# Patient Record
Sex: Female | Born: 1945 | Race: White | Hispanic: No | Smoking: Never smoker
Health system: Southern US, Community
[De-identification: ages and names within clinical notes are randomized; demographics above are authoritative.]

## PROBLEM LIST (undated history)

## (undated) DIAGNOSIS — R569 Unspecified convulsions: Secondary | ICD-10-CM

## (undated) DIAGNOSIS — L309 Dermatitis, unspecified: Secondary | ICD-10-CM

## (undated) DIAGNOSIS — K219 Gastro-esophageal reflux disease without esophagitis: Secondary | ICD-10-CM

## (undated) DIAGNOSIS — M8580 Other specified disorders of bone density and structure, unspecified site: Secondary | ICD-10-CM

## (undated) DIAGNOSIS — G2581 Restless legs syndrome: Secondary | ICD-10-CM

## (undated) DIAGNOSIS — G40909 Epilepsy, unspecified, not intractable, without status epilepticus: Secondary | ICD-10-CM

## (undated) HISTORY — DX: Other specified disorders of bone density and structure, unspecified site: M85.80

## (undated) HISTORY — DX: Dermatitis, unspecified: L30.9

## (undated) HISTORY — DX: Restless legs syndrome: G25.81

## (undated) HISTORY — PX: UVULOPLASTY: SHX6557

## (undated) HISTORY — DX: Epilepsy, unspecified, not intractable, without status epilepticus: G40.909

---

## 1986-11-24 HISTORY — PX: ABDOMINAL HYSTERECTOMY: SHX81

## 1998-11-24 LAB — HM COLONOSCOPY

## 1999-03-06 ENCOUNTER — Other Ambulatory Visit: Admission: RE | Admit: 1999-03-06 | Discharge: 1999-03-06 | Payer: Self-pay | Admitting: Family Medicine

## 1999-05-02 ENCOUNTER — Ambulatory Visit: Admission: RE | Admit: 1999-05-02 | Discharge: 1999-05-02 | Payer: Self-pay | Admitting: Otolaryngology

## 1999-06-06 ENCOUNTER — Other Ambulatory Visit: Admission: RE | Admit: 1999-06-06 | Discharge: 1999-06-06 | Payer: Self-pay | Admitting: Otolaryngology

## 1999-06-06 ENCOUNTER — Encounter (INDEPENDENT_AMBULATORY_CARE_PROVIDER_SITE_OTHER): Payer: Self-pay | Admitting: Specialist

## 2000-06-26 ENCOUNTER — Encounter: Payer: Self-pay | Admitting: Family Medicine

## 2000-06-26 ENCOUNTER — Encounter: Admission: RE | Admit: 2000-06-26 | Discharge: 2000-06-26 | Payer: Self-pay | Admitting: Family Medicine

## 2001-11-24 LAB — FECAL OCCULT BLOOD, GUAIAC: Fecal Occult Blood: NEGATIVE

## 2004-08-08 ENCOUNTER — Encounter: Admission: RE | Admit: 2004-08-08 | Discharge: 2004-08-08 | Payer: Self-pay | Admitting: Family Medicine

## 2004-09-26 ENCOUNTER — Ambulatory Visit: Payer: Self-pay | Admitting: Licensed Clinical Social Worker

## 2004-10-03 ENCOUNTER — Ambulatory Visit: Payer: Self-pay | Admitting: Licensed Clinical Social Worker

## 2004-10-10 ENCOUNTER — Ambulatory Visit: Payer: Self-pay | Admitting: Licensed Clinical Social Worker

## 2004-10-25 ENCOUNTER — Ambulatory Visit: Payer: Self-pay | Admitting: Licensed Clinical Social Worker

## 2004-11-01 ENCOUNTER — Ambulatory Visit: Payer: Self-pay | Admitting: Licensed Clinical Social Worker

## 2004-11-07 ENCOUNTER — Ambulatory Visit: Payer: Self-pay | Admitting: Licensed Clinical Social Worker

## 2004-11-11 ENCOUNTER — Ambulatory Visit: Payer: Self-pay | Admitting: Licensed Clinical Social Worker

## 2004-11-12 ENCOUNTER — Ambulatory Visit: Payer: Self-pay | Admitting: Licensed Clinical Social Worker

## 2004-11-21 ENCOUNTER — Ambulatory Visit: Payer: Self-pay | Admitting: Licensed Clinical Social Worker

## 2004-11-28 ENCOUNTER — Ambulatory Visit: Payer: Self-pay | Admitting: Licensed Clinical Social Worker

## 2004-12-05 ENCOUNTER — Ambulatory Visit: Payer: Self-pay | Admitting: Licensed Clinical Social Worker

## 2004-12-12 ENCOUNTER — Ambulatory Visit: Payer: Self-pay | Admitting: Licensed Clinical Social Worker

## 2004-12-19 ENCOUNTER — Ambulatory Visit: Payer: Self-pay | Admitting: Licensed Clinical Social Worker

## 2004-12-27 ENCOUNTER — Ambulatory Visit: Payer: Self-pay | Admitting: Licensed Clinical Social Worker

## 2005-01-09 ENCOUNTER — Ambulatory Visit: Payer: Self-pay | Admitting: Licensed Clinical Social Worker

## 2005-01-16 ENCOUNTER — Ambulatory Visit: Payer: Self-pay | Admitting: Licensed Clinical Social Worker

## 2005-01-23 ENCOUNTER — Ambulatory Visit: Payer: Self-pay | Admitting: Licensed Clinical Social Worker

## 2005-01-30 ENCOUNTER — Ambulatory Visit: Payer: Self-pay | Admitting: Licensed Clinical Social Worker

## 2005-02-20 ENCOUNTER — Ambulatory Visit: Payer: Self-pay | Admitting: Licensed Clinical Social Worker

## 2005-02-27 ENCOUNTER — Ambulatory Visit: Payer: Self-pay | Admitting: Licensed Clinical Social Worker

## 2005-03-06 ENCOUNTER — Ambulatory Visit: Payer: Self-pay | Admitting: Licensed Clinical Social Worker

## 2005-03-13 ENCOUNTER — Ambulatory Visit: Payer: Self-pay | Admitting: Licensed Clinical Social Worker

## 2005-03-20 ENCOUNTER — Ambulatory Visit: Payer: Self-pay | Admitting: Licensed Clinical Social Worker

## 2005-03-27 ENCOUNTER — Ambulatory Visit: Payer: Self-pay | Admitting: Licensed Clinical Social Worker

## 2005-04-03 ENCOUNTER — Ambulatory Visit: Payer: Self-pay | Admitting: Licensed Clinical Social Worker

## 2005-04-24 ENCOUNTER — Ambulatory Visit: Payer: Self-pay | Admitting: Licensed Clinical Social Worker

## 2005-05-01 ENCOUNTER — Ambulatory Visit: Payer: Self-pay | Admitting: Licensed Clinical Social Worker

## 2005-05-13 ENCOUNTER — Ambulatory Visit: Payer: Self-pay | Admitting: Licensed Clinical Social Worker

## 2005-05-29 ENCOUNTER — Ambulatory Visit: Payer: Self-pay | Admitting: Licensed Clinical Social Worker

## 2005-06-12 ENCOUNTER — Ambulatory Visit: Payer: Self-pay | Admitting: Licensed Clinical Social Worker

## 2005-06-19 ENCOUNTER — Ambulatory Visit: Payer: Self-pay | Admitting: Licensed Clinical Social Worker

## 2005-07-04 ENCOUNTER — Ambulatory Visit: Payer: Self-pay | Admitting: Licensed Clinical Social Worker

## 2005-07-10 ENCOUNTER — Ambulatory Visit: Payer: Self-pay | Admitting: Licensed Clinical Social Worker

## 2005-07-17 ENCOUNTER — Ambulatory Visit: Payer: Self-pay | Admitting: Licensed Clinical Social Worker

## 2005-07-31 ENCOUNTER — Ambulatory Visit: Payer: Self-pay | Admitting: Licensed Clinical Social Worker

## 2005-08-14 ENCOUNTER — Ambulatory Visit: Payer: Self-pay | Admitting: Licensed Clinical Social Worker

## 2005-08-28 ENCOUNTER — Ambulatory Visit: Payer: Self-pay | Admitting: Licensed Clinical Social Worker

## 2005-09-11 ENCOUNTER — Ambulatory Visit: Payer: Self-pay | Admitting: Licensed Clinical Social Worker

## 2005-10-09 ENCOUNTER — Ambulatory Visit: Payer: Self-pay | Admitting: Licensed Clinical Social Worker

## 2006-04-28 ENCOUNTER — Ambulatory Visit: Payer: Self-pay | Admitting: Family Medicine

## 2006-05-05 ENCOUNTER — Ambulatory Visit: Payer: Self-pay | Admitting: Family Medicine

## 2006-05-21 ENCOUNTER — Ambulatory Visit: Payer: Self-pay | Admitting: Internal Medicine

## 2006-06-02 ENCOUNTER — Encounter: Admission: RE | Admit: 2006-06-02 | Discharge: 2006-06-02 | Payer: Self-pay | Admitting: Family Medicine

## 2006-08-31 ENCOUNTER — Ambulatory Visit: Payer: Self-pay | Admitting: Family Medicine

## 2006-11-10 ENCOUNTER — Ambulatory Visit: Payer: Self-pay | Admitting: Licensed Clinical Social Worker

## 2006-12-01 ENCOUNTER — Ambulatory Visit: Payer: Self-pay | Admitting: Licensed Clinical Social Worker

## 2006-12-15 ENCOUNTER — Ambulatory Visit: Payer: Self-pay | Admitting: Licensed Clinical Social Worker

## 2006-12-29 ENCOUNTER — Ambulatory Visit: Payer: Self-pay | Admitting: Licensed Clinical Social Worker

## 2006-12-31 ENCOUNTER — Ambulatory Visit: Payer: Self-pay | Admitting: Family Medicine

## 2007-01-04 ENCOUNTER — Ambulatory Visit: Payer: Self-pay | Admitting: Family Medicine

## 2007-01-12 ENCOUNTER — Ambulatory Visit: Payer: Self-pay | Admitting: Licensed Clinical Social Worker

## 2007-01-26 ENCOUNTER — Ambulatory Visit: Payer: Self-pay | Admitting: Licensed Clinical Social Worker

## 2007-02-18 ENCOUNTER — Ambulatory Visit: Payer: Self-pay | Admitting: Licensed Clinical Social Worker

## 2007-03-04 ENCOUNTER — Ambulatory Visit: Payer: Self-pay | Admitting: Licensed Clinical Social Worker

## 2007-03-18 ENCOUNTER — Ambulatory Visit: Payer: Self-pay | Admitting: Licensed Clinical Social Worker

## 2007-04-15 ENCOUNTER — Ambulatory Visit: Payer: Self-pay | Admitting: Licensed Clinical Social Worker

## 2007-04-15 ENCOUNTER — Ambulatory Visit: Payer: Self-pay | Admitting: Family Medicine

## 2007-04-15 DIAGNOSIS — G40909 Epilepsy, unspecified, not intractable, without status epilepticus: Secondary | ICD-10-CM | POA: Insufficient documentation

## 2007-04-15 DIAGNOSIS — M949 Disorder of cartilage, unspecified: Secondary | ICD-10-CM

## 2007-04-15 DIAGNOSIS — M899 Disorder of bone, unspecified: Secondary | ICD-10-CM

## 2007-04-15 DIAGNOSIS — E109 Type 1 diabetes mellitus without complications: Secondary | ICD-10-CM | POA: Insufficient documentation

## 2007-04-27 ENCOUNTER — Ambulatory Visit: Payer: Self-pay | Admitting: Licensed Clinical Social Worker

## 2007-04-27 ENCOUNTER — Ambulatory Visit: Payer: Self-pay | Admitting: Family Medicine

## 2007-04-27 LAB — CONVERTED CEMR LAB
ALT: 27 units/L (ref 0–40)
AST: 24 units/L (ref 0–37)
Albumin: 4.1 g/dL (ref 3.5–5.2)
Alkaline Phosphatase: 68 units/L (ref 39–117)
BUN: 17 mg/dL (ref 6–23)
Basophils Absolute: 0 10*3/uL (ref 0.0–0.1)
Basophils Relative: 0.4 % (ref 0.0–1.0)
Bilirubin, Direct: 0.1 mg/dL (ref 0.0–0.3)
CO2: 33 meq/L — ABNORMAL HIGH (ref 19–32)
Calcium: 9.9 mg/dL (ref 8.4–10.5)
Chloride: 102 meq/L (ref 96–112)
Cholesterol: 240 mg/dL (ref 0–200)
Creatinine, Ser: 0.8 mg/dL (ref 0.4–1.2)
Direct LDL: 130.2 mg/dL
Eosinophils Absolute: 0.1 10*3/uL (ref 0.0–0.6)
Eosinophils Relative: 1.8 % (ref 0.0–5.0)
GFR calc Af Amer: 93 mL/min
GFR calc non Af Amer: 77 mL/min
Glucose, Bld: 119 mg/dL — ABNORMAL HIGH (ref 70–99)
HCT: 40.3 % (ref 36.0–46.0)
HDL: 65.4 mg/dL (ref >39.0)
Hemoglobin: 14 g/dL (ref 12.0–15.0)
Hgb A1c MFr Bld: 5.7 % (ref 4.6–6.0)
Lymphocytes Relative: 32 % (ref 12.0–46.0)
MCHC: 34.7 g/dL (ref 30.0–36.0)
MCV: 92.7 fL (ref 78.0–100.0)
Monocytes Absolute: 0.5 10*3/uL (ref 0.2–0.7)
Monocytes Relative: 9.5 % (ref 3.0–11.0)
Neutro Abs: 3.1 10*3/uL (ref 1.4–7.7)
Neutrophils Relative %: 56.3 % (ref 43.0–77.0)
Platelets: 237 10*3/uL (ref 150–400)
Potassium: 4.9 meq/L (ref 3.5–5.1)
RBC: 4.34 M/uL (ref 3.87–5.11)
RDW: 13 % (ref 11.5–14.6)
Sodium: 142 meq/L (ref 135–145)
TSH: 1.12 microintl units/mL (ref 0.35–5.50)
Total Bilirubin: 0.7 mg/dL (ref 0.3–1.2)
Total CHOL/HDL Ratio: 3.7
Total Protein: 7.6 g/dL (ref 6.0–8.3)
Triglycerides: 239 mg/dL (ref 0–149)
VLDL: 48 mg/dL — ABNORMAL HIGH (ref 0–40)
WBC: 5.4 10*3/uL (ref 4.5–10.5)

## 2007-05-04 ENCOUNTER — Ambulatory Visit: Payer: Self-pay | Admitting: Licensed Clinical Social Worker

## 2007-05-04 ENCOUNTER — Ambulatory Visit: Payer: Self-pay | Admitting: Family Medicine

## 2007-05-18 ENCOUNTER — Ambulatory Visit: Payer: Self-pay | Admitting: Licensed Clinical Social Worker

## 2007-05-25 ENCOUNTER — Ambulatory Visit: Payer: Self-pay | Admitting: Licensed Clinical Social Worker

## 2007-06-01 ENCOUNTER — Ambulatory Visit: Payer: Self-pay | Admitting: Licensed Clinical Social Worker

## 2007-06-10 ENCOUNTER — Ambulatory Visit: Payer: Self-pay | Admitting: Licensed Clinical Social Worker

## 2007-06-15 ENCOUNTER — Ambulatory Visit: Payer: Self-pay | Admitting: Licensed Clinical Social Worker

## 2007-06-22 ENCOUNTER — Ambulatory Visit: Payer: Self-pay | Admitting: Licensed Clinical Social Worker

## 2007-06-22 ENCOUNTER — Encounter: Admission: RE | Admit: 2007-06-22 | Discharge: 2007-06-22 | Payer: Self-pay | Admitting: Family Medicine

## 2007-07-01 ENCOUNTER — Ambulatory Visit: Payer: Self-pay | Admitting: Licensed Clinical Social Worker

## 2007-07-08 ENCOUNTER — Encounter: Payer: Self-pay | Admitting: Family Medicine

## 2007-07-08 ENCOUNTER — Ambulatory Visit: Payer: Self-pay | Admitting: Licensed Clinical Social Worker

## 2007-07-15 ENCOUNTER — Ambulatory Visit: Payer: Self-pay | Admitting: Licensed Clinical Social Worker

## 2007-07-22 ENCOUNTER — Ambulatory Visit: Payer: Self-pay | Admitting: Licensed Clinical Social Worker

## 2007-08-12 ENCOUNTER — Ambulatory Visit: Payer: Self-pay | Admitting: Licensed Clinical Social Worker

## 2007-08-19 ENCOUNTER — Ambulatory Visit: Payer: Self-pay | Admitting: Licensed Clinical Social Worker

## 2007-08-26 ENCOUNTER — Ambulatory Visit: Payer: Self-pay | Admitting: Licensed Clinical Social Worker

## 2007-09-09 ENCOUNTER — Ambulatory Visit: Payer: Self-pay | Admitting: Licensed Clinical Social Worker

## 2007-09-16 ENCOUNTER — Ambulatory Visit: Payer: Self-pay | Admitting: Licensed Clinical Social Worker

## 2007-09-23 ENCOUNTER — Ambulatory Visit: Payer: Self-pay | Admitting: Licensed Clinical Social Worker

## 2007-09-30 ENCOUNTER — Ambulatory Visit: Payer: Self-pay | Admitting: Licensed Clinical Social Worker

## 2007-10-07 ENCOUNTER — Ambulatory Visit: Payer: Self-pay | Admitting: Licensed Clinical Social Worker

## 2007-10-14 ENCOUNTER — Ambulatory Visit: Payer: Self-pay | Admitting: Licensed Clinical Social Worker

## 2007-10-28 ENCOUNTER — Ambulatory Visit: Payer: Self-pay | Admitting: Licensed Clinical Social Worker

## 2007-11-04 ENCOUNTER — Ambulatory Visit: Payer: Self-pay | Admitting: Licensed Clinical Social Worker

## 2007-11-11 ENCOUNTER — Ambulatory Visit: Payer: Self-pay | Admitting: Licensed Clinical Social Worker

## 2007-11-17 ENCOUNTER — Ambulatory Visit: Payer: Self-pay | Admitting: Licensed Clinical Social Worker

## 2007-11-30 ENCOUNTER — Ambulatory Visit: Payer: Self-pay | Admitting: Licensed Clinical Social Worker

## 2007-12-07 ENCOUNTER — Ambulatory Visit: Payer: Self-pay | Admitting: Licensed Clinical Social Worker

## 2007-12-16 ENCOUNTER — Ambulatory Visit: Payer: Self-pay | Admitting: Licensed Clinical Social Worker

## 2007-12-30 ENCOUNTER — Ambulatory Visit: Payer: Self-pay | Admitting: Licensed Clinical Social Worker

## 2008-01-06 ENCOUNTER — Ambulatory Visit: Payer: Self-pay | Admitting: Licensed Clinical Social Worker

## 2008-01-13 ENCOUNTER — Ambulatory Visit: Payer: Self-pay | Admitting: Licensed Clinical Social Worker

## 2008-01-13 ENCOUNTER — Encounter: Payer: Self-pay | Admitting: Family Medicine

## 2008-01-20 ENCOUNTER — Ambulatory Visit: Payer: Self-pay | Admitting: Licensed Clinical Social Worker

## 2008-01-27 ENCOUNTER — Ambulatory Visit: Payer: Self-pay | Admitting: Licensed Clinical Social Worker

## 2008-02-03 ENCOUNTER — Ambulatory Visit: Payer: Self-pay | Admitting: Licensed Clinical Social Worker

## 2009-01-17 ENCOUNTER — Telehealth: Payer: Self-pay | Admitting: Family Medicine

## 2009-01-23 ENCOUNTER — Ambulatory Visit: Payer: Self-pay | Admitting: Family Medicine

## 2009-01-23 LAB — CONVERTED CEMR LAB
ALT: 27 units/L (ref 0–35)
AST: 24 units/L (ref 0–37)
Alkaline Phosphatase: 75 units/L (ref 39–117)
Bilirubin Urine: NEGATIVE
Bilirubin, Direct: 0.1 mg/dL (ref 0.0–0.3)
Blood in Urine, dipstick: NEGATIVE
CO2: 29 meq/L (ref 19–32)
Chloride: 105 meq/L (ref 96–112)
Cholesterol: 209 mg/dL (ref 0–200)
Glucose, Bld: 90 mg/dL (ref 70–99)
Glucose, Urine, Semiquant: NEGATIVE
Ketones, urine, test strip: NEGATIVE
Lymphocytes Relative: 46.8 % — ABNORMAL HIGH (ref 12.0–46.0)
Monocytes Relative: 9.8 % (ref 3.0–12.0)
Nitrite: NEGATIVE
Platelets: 240 10*3/uL (ref 150–400)
Potassium: 3.8 meq/L (ref 3.5–5.1)
Protein, U semiquant: NEGATIVE
RDW: 13.3 % (ref 11.5–14.6)
Sodium: 142 meq/L (ref 135–145)
Specific Gravity, Urine: 1.02
Total CHOL/HDL Ratio: 3.4
Total Protein: 7.3 g/dL (ref 6.0–8.3)
Triglycerides: 116 mg/dL (ref 0–149)
Urobilinogen, UA: 0.2
VLDL: 23 mg/dL (ref 0–40)
WBC Urine, dipstick: NEGATIVE
WBC: 5.1 10*3/uL (ref 4.5–10.5)
pH: 5.5

## 2009-02-06 ENCOUNTER — Ambulatory Visit: Payer: Self-pay | Admitting: Family Medicine

## 2009-02-06 DIAGNOSIS — H9319 Tinnitus, unspecified ear: Secondary | ICD-10-CM | POA: Insufficient documentation

## 2009-02-07 ENCOUNTER — Telehealth: Payer: Self-pay | Admitting: Family Medicine

## 2009-02-20 ENCOUNTER — Encounter (INDEPENDENT_AMBULATORY_CARE_PROVIDER_SITE_OTHER): Payer: Self-pay | Admitting: *Deleted

## 2009-12-06 ENCOUNTER — Telehealth: Payer: Self-pay | Admitting: Internal Medicine

## 2010-04-15 ENCOUNTER — Telehealth: Payer: Self-pay | Admitting: Family Medicine

## 2010-05-30 ENCOUNTER — Ambulatory Visit: Payer: Self-pay | Admitting: Licensed Clinical Social Worker

## 2010-06-04 ENCOUNTER — Ambulatory Visit: Payer: Self-pay | Admitting: Family Medicine

## 2010-06-04 LAB — HM DIABETES FOOT EXAM

## 2010-06-05 ENCOUNTER — Telehealth: Payer: Self-pay | Admitting: Family Medicine

## 2010-06-06 ENCOUNTER — Ambulatory Visit: Payer: Self-pay | Admitting: Licensed Clinical Social Worker

## 2010-06-12 ENCOUNTER — Encounter: Admission: RE | Admit: 2010-06-12 | Discharge: 2010-06-12 | Payer: Self-pay | Admitting: Family Medicine

## 2010-06-12 LAB — HM MAMMOGRAPHY

## 2010-06-20 ENCOUNTER — Ambulatory Visit: Payer: Self-pay | Admitting: Licensed Clinical Social Worker

## 2010-07-04 ENCOUNTER — Ambulatory Visit: Payer: Self-pay | Admitting: Licensed Clinical Social Worker

## 2010-07-17 ENCOUNTER — Ambulatory Visit: Payer: Self-pay | Admitting: Licensed Clinical Social Worker

## 2010-12-22 LAB — CONVERTED CEMR LAB
ALT: 21 units/L (ref 0–35)
Alkaline Phosphatase: 74 units/L (ref 39–117)
Basophils Absolute: 0 10*3/uL (ref 0.0–0.1)
Bilirubin, Direct: 0.1 mg/dL (ref 0.0–0.3)
CO2: 31 meq/L (ref 19–32)
Chloride: 108 meq/L (ref 96–112)
Glucose, Bld: 94 mg/dL (ref 70–99)
HCT: 38.4 % (ref 36.0–46.0)
Hemoglobin: 13.3 g/dL (ref 12.0–15.0)
Lymphs Abs: 2.7 10*3/uL (ref 0.7–4.0)
MCHC: 34.7 g/dL (ref 30.0–36.0)
Monocytes Relative: 11.6 % (ref 3.0–12.0)
Neutro Abs: 2 10*3/uL (ref 1.4–7.7)
Phenytoin Lvl: 21.7 ug/mL — ABNORMAL HIGH (ref 10.0–20.0)
Potassium: 5.3 meq/L — ABNORMAL HIGH (ref 3.5–5.1)
RDW: 14 % (ref 11.5–14.6)
Sodium: 146 meq/L — ABNORMAL HIGH (ref 135–145)
Total Protein: 7.1 g/dL (ref 6.0–8.3)
Triglycerides: 438 mg/dL — ABNORMAL HIGH (ref 0.0–149.0)

## 2010-12-26 NOTE — Progress Notes (Signed)
  Phone Note Outgoing Call   Summary of Call: labs okay, except for Dilantin level 26.  Would like to see around 15.  Left message for her to decrease her dose to two tabs daily.  Follow-up Dilantin level in 4 weeks Initial call taken by: Roderick Pee MD,  June 05, 2010 9:14 AM

## 2010-12-26 NOTE — Assessment & Plan Note (Signed)
Summary: emp-will fast//ccm   Vital Signs:  Patient profile:   65 year old female Menstrual status:  hysterectomy Height:      59.5 inches Weight:      154 pounds BMI:     30.69 Temp:     98.8 degrees F oral BP sitting:   120 / 84  (left arm) Cuff size:   regular  Vitals Entered By: Kern Reap CMA Duncan Dull) (June 04, 2010 10:47 AM) CC: cpx Is Patient Diabetic? No Pain Assessment Patient in pain? no          Menstrual Status hysterectomy   CC:  cpx.  History of Present Illness: Jennifer Mcneil is a 65 year old female, nonsmoker, who comes in today for evaluation of a chronic seizure disorder, postmenopausal vaginal dryness.  Mild depression.  Her seizure disorder is treated with Dilantin 300 mg daily asymptomatic on medication.  Will check Dilantin level today.  She takes Fosamax 35 mg for osteo- tenia.  She's been on this medication now for over 3 years.  She will stop the Fosamax.  She uses Premarin vaginal cream once or twice weekly for vaginal dryness.  She also takes Prozac 40 mg daily for depression.  Would like to decrease the dose to 20.  She gets routine eye care.  Dental care does BSE monthly, but has not had a mammogram in over 3 years colonoscopy.  Normal.  GI tetanus 2004 declines a flu shot to recommend she get an annual flu shot and call the day and get set up for a mammogram Here for Medicare AWV:  1.   Risk factors based on Past M, S, F history:.....reviewed.  No change 2.   Physical Activities: Marland Kitchen..walks daily 3.   Depression/mood: ........mood good would like to decrease her Prozac from 40 mg a day to 20 4.   Hearing: ...normal 5.   ADL's: ...Marland KitchenMarland KitchenMarland Kitchennormal 6.   Fall Risk.......Marland Kitchenreviewed none  7.   Home Safety: .......Marland Kitchenreviewed 8.   Height, weight, &visual acuity.......Marland Kitchenheight weight, normal eye exam yearly by ophthalmologist 9.   Counseling: ........decrease the Prozacs 11.           Referral Coordination...9 12.           Care Plan........Marland Kitchenreviewed her total  care.  Plan 13.            Cognitive Assessment .........normal mentation  Allergies (verified): No Known Drug Allergies  Past History:  Past medical, surgical, family and social histories (including risk factors) reviewed, and no changes noted (except as noted below).  Past Medical History: Reviewed history from 04/15/2007 and no changes required. Diabetes mellitus, type I Osteopenia Seizure disorder DYS Eczma  Past Surgical History: Reviewed history from 04/15/2007 and no changes required. Hysterectomy/TAH /BSO CB X 1  Family History: Reviewed history and no changes required.  Social History: Reviewed history from 02/06/2009 and no changes required. Retired Single Never Smoked Alcohol use-no Drug use-no Regular exercise-yes  Review of Systems      See HPI  Physical Exam  General:  Well-developed,well-nourished,in no acute distress; alert,appropriate and cooperative throughout examination Head:  Normocephalic and atraumatic without obvious abnormalities. No apparent alopecia or balding. Eyes:  No corneal or conjunctival inflammation noted. EOMI. Perrla. Funduscopic exam benign, without hemorrhages, exudates or papilledema. Vision grossly normal. Ears:  External ear exam shows no significant lesions or deformities.  Otoscopic examination reveals clear canals, tympanic membranes are intact bilaterally without bulging, retraction, inflammation or discharge. Hearing is grossly normal bilaterally. Nose:  External nasal examination  shows no deformity or inflammation. Nasal mucosa are pink and moist without lesions or exudates. Mouth:  Oral mucosa and oropharynx without lesions or exudates.  Teeth in good repair. Neck:  No deformities, masses, or tenderness noted. Chest Wall:  No deformities, masses, or tenderness noted. Breasts:  No mass, nodules, thickening, tenderness, bulging, retraction, inflamation, nipple discharge or skin changes noted.   Lungs:  Normal respiratory  effort, chest expands symmetrically. Lungs are clear to auscultation, no crackles or wheezes. Heart:  Normal rate and regular rhythm. S1 and S2 normal without gallop, murmur, click, rub or other extra sounds. Abdomen:  Bowel sounds positive,abdomen soft and non-tender without masses, organomegaly or hernias noted. Rectal:  No external abnormalities noted. Normal sphincter tone. No rectal masses or tenderness. Genitalia:  Pelvic Exam:        External: normal female genitalia without lesions or masses        Vagina: normal without lesions or masses        Cervix: normal without lesions or masses        Adnexa: normal bimanual exam without masses or fullness        Uterus: normal by palpation        Pap smear: not performed Msk:  No deformity or scoliosis noted of thoracic or lumbar spine.   Pulses:  R and L carotid,radial,femoral,dorsalis pedis and posterior tibial pulses are full and equal bilaterally Extremities:  No clubbing, cyanosis, edema, or deformity noted with normal full range of motion of all joints.   Neurologic:  No cranial nerve deficits noted. Station and gait are normal. Plantar reflexes are down-going bilaterally. DTRs are symmetrical throughout. Sensory, motor and coordinative functions appear intact. Skin:  Intact without suspicious lesions or rashes Cervical Nodes:  No lymphadenopathy noted Axillary Nodes:  No palpable lymphadenopathy Inguinal Nodes:  No significant adenopathy Psych:  Cognition and judgment appear intact. Alert and cooperative with normal attention span and concentration. No apparent delusions, illusions, hallucinations  Diabetes Management Exam:    Foot Exam (with socks and/or shoes not present):       Sensory-Pinprick/Light touch:          Left medial foot (L-4): normal          Left dorsal foot (L-5): normal          Left lateral foot (S-1): normal          Right medial foot (L-4): normal          Right dorsal foot (L-5): normal          Right  lateral foot (S-1): normal       Sensory-Monofilament:          Left foot: normal          Right foot: normal       Inspection:          Left foot: normal          Right foot: normal       Nails:          Left foot: normal          Right foot: normal    Eye Exam:       Eye Exam done elsewhere          Date: 08/01/2010          Results: normal          Done by: opth   Impression & Recommendations:  Problem # 1:  SEIZURE DISORDER (  ICD-780.39) Assessment Unchanged  Her updated medication list for this problem includes:    Dilantin 100 Mg Caps (Phenytoin sodium extended) .Marland KitchenMarland KitchenMarland KitchenMarland Kitchen 3 capsule by mouth every morning  Orders: Prescription Created Electronically [phone]) First annual wellness visit with prevention plan  (N5621) UA Dipstick w/o Micro (automated)  (81003) Venipuncture (30865) TLB-Lipid Panel (80061-LIPID) TLB-BMP (Basic Metabolic Panel-BMET) (80048-METABOL) TLB-CBC Platelet - w/Differential (85025-CBCD) TLB-Hepatic/Liver Function Pnl (80076-HEPATIC) TLB-TSH (Thyroid Stimulating Hormone) (84443-TSH) TLB-A1C / Hgb A1C (Glycohemoglobin) (83036-A1C) TLB-Phenytoin (Dilantin) (80185-DILAN)  Problem # 2:  OSTEOPENIA (ICD-733.90) Assessment: Improved  The following medications were removed from the medication list:    Fosamax 35 Mg Tabs (Alendronate sodium)  Orders: Prescription Created Electronically [phone]) First annual wellness visit with prevention plan  (G2952) UA Dipstick w/o Micro (automated)  (81003) Venipuncture (84132) TLB-Lipid Panel (80061-LIPID) TLB-BMP (Basic Metabolic Panel-BMET) (80048-METABOL) TLB-CBC Platelet - w/Differential (85025-CBCD) TLB-Hepatic/Liver Function Pnl (80076-HEPATIC) TLB-TSH (Thyroid Stimulating Hormone) (84443-TSH) TLB-A1C / Hgb A1C (Glycohemoglobin) (83036-A1C)  Problem # 3:  ROUTINE GENERAL MEDICAL EXAM@HEALTH  CARE FACL (ICD-V70.0) Assessment: New  Orders: Prescription Created Electronically [phone]) First annual wellness  visit with prevention plan  (U7253) UA Dipstick w/o Micro (automated)  (81003) Venipuncture (66440) TLB-Lipid Panel (80061-LIPID) TLB-BMP (Basic Metabolic Panel-BMET) (80048-METABOL) TLB-CBC Platelet - w/Differential (85025-CBCD) TLB-Hepatic/Liver Function Pnl (80076-HEPATIC) TLB-TSH (Thyroid Stimulating Hormone) (84443-TSH) TLB-A1C / Hgb A1C (Glycohemoglobin) (83036-A1C)  Complete Medication List: 1)  Dilantin 100 Mg Caps (Phenytoin sodium extended) .... 3 capsule by mouth every morning 2)  Premarin 0.625 Mg/gm Crea (Estrogens, conjugated) .... Insert 1 a small amount into vagina as directed 3)  Probiotic Pearls Caps (Probiotic product) .... Take 2 tabs by mouth once daily 4)  Grape Seed Xtra Caps (Misc natural products) .... Take one tab by mouth once daily 5)  Vitamin C 100 Mg Tabs (Ascorbic acid) .... Take one tab by mouth once daily 6)  Vitamin E 100 Unit Caps (Vitamin e) .... Take one tab by mouth once daily 7)  Folic Acid 1 Mg Tabs (Folic acid) .... Take one tab by mouth once daily 8)  Prozac 20 Mg Caps (Fluoxetine hcl) .... Take 1 tablet by mouth every morning  Patient Instructions: 1)   stop the Fosamax continue calcium, vitamin D, and daily walking.  We will get a follow-up bone density in two years. 2)  Decrease the Prozac to 20 mg daily.  Return p.r.n. 3)  Please schedule a follow-up appointment in 1 year. 4)  Schedule your mammogram.  today 5)  Take an Aspirin every day. Prescriptions: PREMARIN 0.625 MG/GM CREA (ESTROGENS, CONJUGATED) Insert 1 a small amount into vagina as directed  #3 tubes x 4   Entered and Authorized by:   Roderick Pee MD   Signed by:   Roderick Pee MD on 06/04/2010   Method used:   Electronically to        [facility] [phone]* (retail)       [address]       [place], [place]  [address]       Ph: [phone]       Fax: [phone]   RxID:   [id number] DILANTIN 100 MG CAPS (PHENYTOIN SODIUM EXTENDED) 3  capsule by mouth every morning  #300 Capsule x 3   Entered and Authorized by:   Roderick Pee MD   Signed by:   Roderick Pee MD on 06/04/2010   Method used:   [facility] to  [facility] [phone]* (retail)       [address]       [place], [place]  [address]       Ph: [phone]       Fax: [phone]   RxID:   [id number] PROZAC 20 MG CAPS (FLUOXETINE HCL) Take 1 tablet by mouth every morning  #100 x 3   Entered and Authorized by:   Roderick Pee MD   Signed by:   Roderick Pee MD on 06/04/2010   Method used:   Electronically to        [name] [phone]* (retail)       [address]       [place], [place]  [address]       Ph: [phone]       Fax: [phone]   RxID:   [id number]     Appended Document: emp-will fast//ccm     Allergies: No Known Drug Allergies   Complete Medication List: 1)  Dilantin 100 Mg Caps (Phenytoin sodium extended) .... 3 capsule by mouth every morning 2)  Premarin 0.625 Mg/gm Crea (Estrogens, conjugated) .... Insert 1 a small amount into vagina as directed 3)  Probiotic Pearls Caps (Probiotic product) .... Take 2 tabs by mouth once daily 4)  Grape Seed Xtra Caps (Misc natural products) .... Take one tab by mouth once daily 5)  Vitamin C 100 Mg Tabs (Ascorbic acid) .... Take one tab by mouth once daily 6)  Vitamin E 100 Unit Caps (Vitamin e) .... Take one tab by mouth once daily 7)  Folic Acid 1 Mg Tabs (Folic acid) .... Take one tab by mouth once daily 8)  Prozac 20 Mg Caps (Fluoxetine hcl) .... Take 1 tablet by mouth every morning  Other Orders: EKG w/ Interpretation (93000)  Appended Document: emp-will fast//ccm     Allergies: No Known Drug Allergies   Complete Medication List: 1)  Dilantin 100 Mg Caps (Phenytoin sodium extended) .... 3 capsule by mouth every morning 2)  Premarin 0.625 Mg/gm Crea (Estrogens, conjugated) .... Insert 1 a small  amount into vagina as directed 3)  Probiotic Pearls Caps (Probiotic product) .... Take 2 tabs by mouth once daily 4)  Grape Seed Xtra Caps (Misc natural products) .... Take one tab by mouth once daily 5)  Vitamin C 100 Mg Tabs (Ascorbic acid) .... Take one tab by mouth once daily 6)  Vitamin E 100 Unit Caps (Vitamin e) .... Take one tab by mouth once daily 7)  Folic Acid 1 Mg Tabs (Folic acid) .... Take one tab by mouth once daily 8)  Prozac 20 Mg Caps (Fluoxetine hcl) .... Take 1 tablet by mouth every morning  Other Orders: Pneumococcal Vaccine (70350) Admin 1st Vaccine (09381)    Immunizations Administered:  Pneumonia Vaccine:    Vaccine Type: Pneumovax    Site: right deltoid    Mfr: Merck    Dose: 0.5 ml    Route: IM    Given by: Kern Reap CMA (AAMA)    Exp. Date: 09/18/2011    Lot #: [id number]    Physician counseled: yes

## 2010-12-26 NOTE — Progress Notes (Signed)
Summary: refill  Phone Note Refill Request Call back at Home Phone [phone] Message from:  Patient---live call  Refills Requested: Medication #1:  DILANTIN 100 MG CAPS 3 capsule by mouth every morning   [facility] Name Necessary? [facility]px  is in july.     Prescriptions: DILANTIN 100 MG CAPS (PHENYTOIN SODIUM EXTENDED) 3 capsule by mouth every morning  #300 Capsule x 3   Entered by:   Kern Reap CMA (AAMA)   Authorized by:   Roderick Pee MD   Signed by:   Kern Reap CMA (AAMA) on 04/15/2010   Method used:   [facility]ronically to        [facility] [phone]* (retail)       [address]       [place], [place]  [address]       Ph: [phone]       Fax: [phone]   RxID:   [id number]

## 2010-12-26 NOTE — Progress Notes (Signed)
Summary: Schedule Colonoscopy  Phone Note Outgoing Call   Call placed by: Hortense Ramal CMA Duncan Dull),  December 06, 2009 3:01 PM Call placed to: Patient Summary of Call: Left message for patient to call back. She needs to schedule her recall colonoscopy due to age. It has been 10 years since her last colonoscopy procedure. Initial call taken by: Hortense Ramal CMA Duncan Dull),  December 06, 2009 3:01 PM  Follow-up for Phone Call        Left message for patient to call back to schedule colonoscopy. Hortense Ramal CMA Duncan Dull)  December 11, 2009 1:52 PM   Left message to call back. Hortense Ramal CMA Duncan Dull)  December 14, 2009 9:23 AM     Additional Follow-up for Phone Call Additional follow up Details #2::    No call back from patient recieved. We will send a letter. Follow-up by: Hortense Ramal CMA Duncan Dull),  December 18, 2009 9:44 AM   Appended Document: Schedule Colonoscopy May I , please review her paper chart or  scan Colon into EMR?

## 2011-02-25 ENCOUNTER — Ambulatory Visit (INDEPENDENT_AMBULATORY_CARE_PROVIDER_SITE_OTHER): Payer: Medicare HMO | Admitting: Licensed Clinical Social Worker

## 2011-02-25 DIAGNOSIS — F331 Major depressive disorder, recurrent, moderate: Secondary | ICD-10-CM

## 2011-03-04 ENCOUNTER — Ambulatory Visit (INDEPENDENT_AMBULATORY_CARE_PROVIDER_SITE_OTHER): Payer: Medicare HMO | Admitting: Licensed Clinical Social Worker

## 2011-03-04 DIAGNOSIS — F331 Major depressive disorder, recurrent, moderate: Secondary | ICD-10-CM

## 2011-03-11 ENCOUNTER — Ambulatory Visit (INDEPENDENT_AMBULATORY_CARE_PROVIDER_SITE_OTHER): Payer: Medicare HMO | Admitting: Licensed Clinical Social Worker

## 2011-03-11 DIAGNOSIS — F331 Major depressive disorder, recurrent, moderate: Secondary | ICD-10-CM

## 2011-03-25 ENCOUNTER — Ambulatory Visit: Payer: Medicare HMO | Admitting: Licensed Clinical Social Worker

## 2011-03-26 ENCOUNTER — Ambulatory Visit (INDEPENDENT_AMBULATORY_CARE_PROVIDER_SITE_OTHER): Payer: Medicare HMO | Admitting: Licensed Clinical Social Worker

## 2011-03-26 DIAGNOSIS — F331 Major depressive disorder, recurrent, moderate: Secondary | ICD-10-CM

## 2011-04-24 ENCOUNTER — Ambulatory Visit (INDEPENDENT_AMBULATORY_CARE_PROVIDER_SITE_OTHER): Payer: Medicare HMO | Admitting: Licensed Clinical Social Worker

## 2011-04-24 DIAGNOSIS — F331 Major depressive disorder, recurrent, moderate: Secondary | ICD-10-CM

## 2011-05-08 ENCOUNTER — Ambulatory Visit (INDEPENDENT_AMBULATORY_CARE_PROVIDER_SITE_OTHER): Payer: Medicare HMO | Admitting: Licensed Clinical Social Worker

## 2011-05-08 DIAGNOSIS — F331 Major depressive disorder, recurrent, moderate: Secondary | ICD-10-CM

## 2011-05-29 ENCOUNTER — Other Ambulatory Visit: Payer: Self-pay | Admitting: Family Medicine

## 2011-06-06 ENCOUNTER — Ambulatory Visit (INDEPENDENT_AMBULATORY_CARE_PROVIDER_SITE_OTHER): Payer: Medicare HMO | Admitting: Family Medicine

## 2011-06-06 ENCOUNTER — Encounter: Payer: Self-pay | Admitting: Family Medicine

## 2011-06-06 VITALS — BP 130/90 | Temp 98.3°F | Ht 59.5 in | Wt 154.0 lb

## 2011-06-06 DIAGNOSIS — F329 Major depressive disorder, single episode, unspecified: Secondary | ICD-10-CM

## 2011-06-06 DIAGNOSIS — Z0000 Encounter for general adult medical examination without abnormal findings: Secondary | ICD-10-CM

## 2011-06-06 DIAGNOSIS — R569 Unspecified convulsions: Secondary | ICD-10-CM

## 2011-06-06 DIAGNOSIS — E109 Type 1 diabetes mellitus without complications: Secondary | ICD-10-CM

## 2011-06-06 DIAGNOSIS — N952 Postmenopausal atrophic vaginitis: Secondary | ICD-10-CM

## 2011-06-06 LAB — HEPATIC FUNCTION PANEL
ALT: 19 U/L (ref 0–35)
Alkaline Phosphatase: 72 U/L (ref 39–117)
Bilirubin, Direct: 0 mg/dL (ref 0.0–0.3)
Total Protein: 7.7 g/dL (ref 6.0–8.3)

## 2011-06-06 LAB — LIPID PANEL
HDL: 62 mg/dL (ref >39.00)
Total CHOL/HDL Ratio: 4

## 2011-06-06 LAB — CBC WITH DIFFERENTIAL/PLATELET
Basophils Relative: 0.7 % (ref 0.0–3.0)
Hemoglobin: 13.2 g/dL (ref 12.0–15.0)
Lymphocytes Relative: 43.9 % (ref 12.0–46.0)
Monocytes Relative: 9.8 % (ref 3.0–12.0)
Neutro Abs: 2 10*3/uL (ref 1.4–7.7)
RBC: 4.21 Mil/uL (ref 3.87–5.11)
WBC: 5.3 10*3/uL (ref 4.5–10.5)

## 2011-06-06 LAB — POCT URINALYSIS DIPSTICK
Bilirubin, UA: NEGATIVE
Ketones, UA: NEGATIVE
Leukocytes, UA: NEGATIVE
Nitrite, UA: NEGATIVE
Protein, UA: NEGATIVE

## 2011-06-06 LAB — BASIC METABOLIC PANEL
BUN: 13 mg/dL (ref 6–23)
Chloride: 108 mEq/L (ref 96–112)
Creatinine, Ser: 0.7 mg/dL (ref 0.4–1.2)
GFR: 83.12 mL/min (ref >60.00)
Potassium: 4.5 mEq/L (ref 3.5–5.1)

## 2011-06-06 LAB — TSH: TSH: 0.69 u[IU]/mL (ref 0.35–5.50)

## 2011-06-06 MED ORDER — FLUOXETINE HCL 20 MG PO CAPS: 20.0000 mg | ORAL_CAPSULE | Freq: Every day | ORAL | Status: DC

## 2011-06-06 MED ORDER — ESTROGENS, CONJUGATED 0.625 MG/GM VA CREA: 1.0000 g | TOPICAL_CREAM | Freq: Every day | VAGINAL | Status: DC

## 2011-06-06 MED ORDER — PHENYTOIN SODIUM EXTENDED 100 MG PO CAPS: ORAL_CAPSULE | ORAL | Status: DC

## 2011-06-06 NOTE — Patient Instructions (Signed)
Continue your current medications.  Follow-up in one year, sooner if any problems.  Begin a walking program 15 minutes daily

## 2011-06-06 NOTE — Progress Notes (Signed)
  Subjective:    Patient ID: Jennifer Mcneil, female    DOB: 05-24-1946, 65 y.o.   MRN: [id number]  HPI [name] is a 65 year old single female, nonsmoker, who comes in today for general Medicare at wellness examination.  He has a history of underlying seizure disorder, depression, postmenopausal vaginal dryness.  She takes Dilantin 200 mg daily.  Will check Dilantin level.  She takes Prozac 20 mg nightly for mild depression.  Doing well despite the fact that her 41 year old daughter died this year.  Her daughter was a drug addict and died of sepsis.  If uses Premarin vaginal cream twice weekly for vaginal dryness.  She takes calcium and vitamin D, but does not exercise on a regular basis.  She gets routine eye care, dental care, BSE monthly, and you mammography, colonoscopy, normal, and GI, hearing normal, intellectual function independent.,Home safety reviewed the been no issues.  There.  No guns in the house.  She does have a living will, but not a healthcare power of attorney     Review of Systems  Constitutional: Negative.   HENT: Negative.   Eyes: Negative.   Respiratory: Negative.   Cardiovascular: Negative.   Gastrointestinal: Negative.   Genitourinary: Negative.   Musculoskeletal: Negative.   Neurological: Negative.   Hematological: Negative.   Psychiatric/Behavioral: Negative.        Objective:   Physical Exam  Constitutional: She appears well-developed and well-nourished.  HENT:  Head: Normocephalic and atraumatic.  Right Ear: External ear normal.  Left Ear: External ear normal.  Nose: Nose normal.  Mouth/Throat: Oropharynx is clear and moist.  Eyes: EOM are normal. Pupils are equal, round, and reactive to light.  Neck: Normal range of motion. Neck supple. No thyromegaly present.  Cardiovascular: Normal rate, regular rhythm, normal heart sounds and intact distal pulses.  Exam reveals no gallop and no friction rub.   No murmur heard. Pulmonary/Chest: Effort normal  and breath sounds normal.  Abdominal: Soft. Bowel sounds are normal. She exhibits no distension and no mass. There is no tenderness. There is no rebound.  Genitourinary: Vagina normal. Guaiac negative stool. No vaginal discharge found.  Musculoskeletal: Normal range of motion.  Lymphadenopathy:    She has no cervical adenopathy.  Neurological: She is alert. She has normal reflexes. No cranial nerve deficit. She exhibits normal muscle tone. Coordination normal.  Skin: Skin is warm and dry.  Psychiatric: She has a normal mood and affect. Her behavior is normal. Judgment and thought content normal.          Assessment & Plan:  Healthy female.  History of seizure disorder continue Dilantin 200 mg daily and check Dilantin level.  History of depression.  Continue Prozac 20 daily.  Postmenopausal vaginal dryness.  Continue Premarin vaginal cream twice weekly.

## 2011-10-28 ENCOUNTER — Other Ambulatory Visit: Payer: Self-pay | Admitting: Family Medicine

## 2011-11-27 ENCOUNTER — Ambulatory Visit (INDEPENDENT_AMBULATORY_CARE_PROVIDER_SITE_OTHER): Payer: Medicare HMO | Admitting: Family Medicine

## 2011-11-27 ENCOUNTER — Encounter: Payer: Self-pay | Admitting: Family Medicine

## 2011-11-27 DIAGNOSIS — M25511 Pain in right shoulder: Secondary | ICD-10-CM

## 2011-11-27 DIAGNOSIS — G2581 Restless legs syndrome: Secondary | ICD-10-CM

## 2011-11-27 DIAGNOSIS — M25519 Pain in unspecified shoulder: Secondary | ICD-10-CM

## 2011-11-27 DIAGNOSIS — G8929 Other chronic pain: Secondary | ICD-10-CM | POA: Insufficient documentation

## 2011-11-27 MED ORDER — PRAMIPEXOLE DIHYDROCHLORIDE 0.5 MG PO TABS: 0.5000 mg | ORAL_TABLET | Freq: Every day | ORAL | Status: DC

## 2011-11-27 NOTE — Patient Instructions (Signed)
Begin Mirapex, one tablet at bedtime.  Return in 3 weeks for follow-up.  Take 400 mg of Motrin twice daily with food, and we will get to set up for a physical therapy consult for evaluation and treatment

## 2011-11-27 NOTE — Progress Notes (Signed)
  Subjective:    Patient ID: Jennifer Mcneil, female    DOB: January 29, 1946, 66 y.o.   MRN: [id number]  Picking is a 66 year old female, nonsmoker, who comes in today for evaluation in two problems.  She states for the past 3 months.  She is having cramps in both her legs.  It wakes her up at night.  She is to get up and walk around.  Also for the past 6, months.  She's had pain in her right shoulder.  Now she is noticing decreased range of motion.  No history of trauma    Review of Systems General neurologic and musculoskeletal review of systems otherwise negative    Objective:   Physical Exam Well-developed well-nourished, female in acute distress.  Examination of lower extremities shows the legs appear to be normal.  Skin normal.  Pulses normal.  No palpable or abnormalities.  Full range of motion.  Hips, knees, and ankles.  No obvious skin lesions.  Right shoulder shows she is able to externally rotate her shoulder, but she is lacking about 15 degrees in a deduction       Assessment & Plan:  Restless leg syndrome, began Mirapex nightly follow-up in 3 weeks.  Pain right shoulder Motrin 400 mg b.i.d. Physical therapy consult

## 2011-12-08 ENCOUNTER — Ambulatory Visit: Payer: Medicare HMO | Attending: Family Medicine

## 2011-12-08 DIAGNOSIS — M25519 Pain in unspecified shoulder: Secondary | ICD-10-CM | POA: Insufficient documentation

## 2011-12-08 DIAGNOSIS — M25619 Stiffness of unspecified shoulder, not elsewhere classified: Secondary | ICD-10-CM | POA: Insufficient documentation

## 2011-12-08 DIAGNOSIS — IMO0001 Reserved for inherently not codable concepts without codable children: Secondary | ICD-10-CM | POA: Insufficient documentation

## 2011-12-08 DIAGNOSIS — M6281 Muscle weakness (generalized): Secondary | ICD-10-CM | POA: Insufficient documentation

## 2011-12-18 ENCOUNTER — Ambulatory Visit: Payer: Medicare HMO | Admitting: Family Medicine

## 2011-12-23 ENCOUNTER — Ambulatory Visit: Payer: Medicare HMO

## 2012-02-26 ENCOUNTER — Other Ambulatory Visit: Payer: Self-pay | Admitting: Family Medicine

## 2012-04-05 ENCOUNTER — Other Ambulatory Visit: Payer: Self-pay | Admitting: Family Medicine

## 2012-05-23 ENCOUNTER — Other Ambulatory Visit: Payer: Self-pay | Admitting: Family Medicine

## 2012-06-08 ENCOUNTER — Encounter: Payer: Self-pay | Admitting: Internal Medicine

## 2012-09-20 ENCOUNTER — Ambulatory Visit (INDEPENDENT_AMBULATORY_CARE_PROVIDER_SITE_OTHER): Payer: Medicare HMO | Admitting: Family Medicine

## 2012-09-20 ENCOUNTER — Encounter: Payer: Self-pay | Admitting: Family Medicine

## 2012-09-20 VITALS — BP 118/80 | Temp 98.0°F | Ht 58.75 in | Wt 160.0 lb

## 2012-09-20 DIAGNOSIS — Z0000 Encounter for general adult medical examination without abnormal findings: Secondary | ICD-10-CM

## 2012-09-20 DIAGNOSIS — G2581 Restless legs syndrome: Secondary | ICD-10-CM

## 2012-09-20 DIAGNOSIS — F329 Major depressive disorder, single episode, unspecified: Secondary | ICD-10-CM

## 2012-09-20 DIAGNOSIS — N952 Postmenopausal atrophic vaginitis: Secondary | ICD-10-CM

## 2012-09-20 DIAGNOSIS — R569 Unspecified convulsions: Secondary | ICD-10-CM

## 2012-09-20 DIAGNOSIS — Z23 Encounter for immunization: Secondary | ICD-10-CM

## 2012-09-20 DIAGNOSIS — E109 Type 1 diabetes mellitus without complications: Secondary | ICD-10-CM

## 2012-09-20 LAB — CBC WITH DIFFERENTIAL/PLATELET
Basophils Relative: 0.8 % (ref 0.0–3.0)
Eosinophils Relative: 2.5 % (ref 0.0–5.0)
Lymphocytes Relative: 37.9 % (ref 12.0–46.0)
MCV: 91.9 fl (ref 78.0–100.0)
Monocytes Absolute: 0.5 10*3/uL (ref 0.1–1.0)
Monocytes Relative: 9.2 % (ref 3.0–12.0)
Neutrophils Relative %: 49.6 % (ref 43.0–77.0)
RBC: 4.14 Mil/uL (ref 3.87–5.11)
WBC: 5.4 10*3/uL (ref 4.5–10.5)

## 2012-09-20 LAB — BASIC METABOLIC PANEL
CO2: 27 mEq/L (ref 19–32)
Chloride: 108 mEq/L (ref 96–112)
Glucose, Bld: 98 mg/dL (ref 70–99)
Potassium: 4.3 mEq/L (ref 3.5–5.1)
Sodium: 140 mEq/L (ref 135–145)

## 2012-09-20 MED ORDER — PHENYTOIN SODIUM EXTENDED 100 MG PO CAPS: ORAL_CAPSULE | ORAL | Status: DC

## 2012-09-20 MED ORDER — FLUOXETINE HCL 20 MG PO CAPS: 20.0000 mg | ORAL_CAPSULE | Freq: Every day | ORAL | Status: DC

## 2012-09-20 MED ORDER — ESTROGENS, CONJUGATED 0.625 MG/GM VA CREA: 1.0000 g | TOPICAL_CREAM | Freq: Every day | VAGINAL | Status: DC

## 2012-09-20 MED ORDER — PRAMIPEXOLE DIHYDROCHLORIDE 0.5 MG PO TABS: 0.5000 mg | ORAL_TABLET | ORAL | Status: DC

## 2012-09-20 NOTE — Progress Notes (Signed)
  Subjective:    Patient ID: Jennifer Mcneil, female    DOB: 1946/06/29, 66 y.o.   MRN: [id number]  HPI [name] is a 66 year old married female nonsmoker who comes in today for a Medicare wellness examination because of a history of post and a possible vaginal dryness,,,,,,,, she had a TAH and BSO at age 64 because of fibroids,,,,,,,, seizure disorder, restless leg syndrome, mild depression.  Her medications reviewed in detail and there've been no changes. Except her Dilantin dose was decreased to 200 mg daily from 300 mg daily because her Dilantin level last year was slightly elevated.  She gets routine eye care, dental care, BSE monthly, has not had a mammogram in over 2 years, advised to go get a mammogram ASAP, colonoscopy and GI normal, tetanus 2004, Pneumovax 2011, shingles information given, seasonal flu shot today.  Cognitive function normal she walks on a regular basis home health safety reviewed no issues identified, no guns in the house, she does have a health care power of attorney and living will.   Review of Systems  Constitutional: Negative.   HENT: Negative.   Eyes: Negative.   Respiratory: Negative.   Cardiovascular: Negative.   Gastrointestinal: Negative.   Genitourinary: Negative.   Musculoskeletal: Negative.   Neurological: Negative.   Hematological: Negative.   Psychiatric/Behavioral: Negative.        Objective:   Physical Exam  Constitutional: She appears well-developed and well-nourished.  HENT:  Head: Normocephalic and atraumatic.  Right Ear: External ear normal.  Left Ear: External ear normal.  Nose: Nose normal.  Mouth/Throat: Oropharynx is clear and moist.  Eyes: EOM are normal. Pupils are equal, round, and reactive to light.  Neck: Normal range of motion. Neck supple. No thyromegaly present.  Cardiovascular: Normal rate, regular rhythm, normal heart sounds and intact distal pulses.  Exam reveals no gallop and no friction rub.   No murmur  heard. Pulmonary/Chest: Effort normal and breath sounds normal.  Abdominal: Soft. Bowel sounds are normal. She exhibits no distension and no mass. There is no tenderness. There is no rebound.  Musculoskeletal: Normal range of motion.  Lymphadenopathy:    She has no cervical adenopathy.  Neurological: She is alert. She has normal reflexes. No cranial nerve deficit. She exhibits normal muscle tone. Coordination normal.  Skin: Skin is warm and dry.  Psychiatric: She has a normal mood and affect. Her behavior is normal. Judgment and thought content normal.          Assessment & Plan:  Healthy female  Postmenopausal vaginal dryness continue Premarin vaginal cream  History of mild depression continue Prozac 20 mg daily  History of seizure disorder continue Dilantin 200 mg daily check Dilantin level  Restless leg syndrome Mirapex 0.5 each bedtime  Encouraged to call today and get set up for a mammogram since she's not had one in over 2 years

## 2012-09-20 NOTE — Patient Instructions (Signed)
Continue current medications  We will call you the report of your lab work  Call today and get set up for a screening mammogram

## 2012-09-21 LAB — PHENYTOIN LEVEL, TOTAL: Phenytoin Lvl: 10.8 ug/mL (ref 10.0–20.0)

## 2012-10-02 ENCOUNTER — Other Ambulatory Visit: Payer: Self-pay | Admitting: Family Medicine

## 2012-10-15 ENCOUNTER — Ambulatory Visit (INDEPENDENT_AMBULATORY_CARE_PROVIDER_SITE_OTHER): Payer: Medicare HMO | Admitting: Licensed Clinical Social Worker

## 2012-10-15 DIAGNOSIS — F331 Major depressive disorder, recurrent, moderate: Secondary | ICD-10-CM

## 2012-10-23 ENCOUNTER — Other Ambulatory Visit: Payer: Self-pay | Admitting: Family Medicine

## 2012-10-29 ENCOUNTER — Ambulatory Visit (INDEPENDENT_AMBULATORY_CARE_PROVIDER_SITE_OTHER): Payer: Medicare HMO | Admitting: Licensed Clinical Social Worker

## 2012-10-29 DIAGNOSIS — F331 Major depressive disorder, recurrent, moderate: Secondary | ICD-10-CM

## 2012-12-03 ENCOUNTER — Ambulatory Visit: Payer: Medicare HMO | Admitting: Licensed Clinical Social Worker

## 2012-12-13 ENCOUNTER — Ambulatory Visit (INDEPENDENT_AMBULATORY_CARE_PROVIDER_SITE_OTHER): Payer: Medicare HMO | Admitting: Licensed Clinical Social Worker

## 2012-12-13 DIAGNOSIS — F331 Major depressive disorder, recurrent, moderate: Secondary | ICD-10-CM

## 2013-05-06 ENCOUNTER — Other Ambulatory Visit: Payer: Self-pay

## 2013-05-06 DIAGNOSIS — Z1231 Encounter for screening mammogram for malignant neoplasm of breast: Secondary | ICD-10-CM

## 2013-06-03 ENCOUNTER — Ambulatory Visit
Admission: RE | Admit: 2013-06-03 | Discharge: 2013-06-03 | Disposition: A | Discharge status: Home / Self Care | Payer: Medicare HMO | Source: Ambulatory Visit

## 2013-06-03 DIAGNOSIS — Z1231 Encounter for screening mammogram for malignant neoplasm of breast: Secondary | ICD-10-CM

## 2013-07-21 ENCOUNTER — Ambulatory Visit: Payer: Medicare HMO | Admitting: Licensed Clinical Social Worker

## 2013-07-28 ENCOUNTER — Ambulatory Visit (INDEPENDENT_AMBULATORY_CARE_PROVIDER_SITE_OTHER): Payer: Medicare HMO | Admitting: Licensed Clinical Social Worker

## 2013-07-28 DIAGNOSIS — F331 Major depressive disorder, recurrent, moderate: Secondary | ICD-10-CM

## 2013-09-12 ENCOUNTER — Other Ambulatory Visit (INDEPENDENT_AMBULATORY_CARE_PROVIDER_SITE_OTHER): Payer: Commercial Managed Care - HMO

## 2013-09-12 DIAGNOSIS — Z0000 Encounter for general adult medical examination without abnormal findings: Secondary | ICD-10-CM

## 2013-09-12 LAB — CBC WITH DIFFERENTIAL/PLATELET
Basophils Relative: 0.8 % (ref 0.0–3.0)
Eosinophils Absolute: 0.1 10*3/uL (ref 0.0–0.7)
HCT: 38.3 % (ref 36.0–46.0)
Hemoglobin: 13 g/dL (ref 12.0–15.0)
Lymphocytes Relative: 44.2 % (ref 12.0–46.0)
Lymphs Abs: 2.4 10*3/uL (ref 0.7–4.0)
MCV: 90.3 fl (ref 78.0–100.0)
Monocytes Absolute: 0.6 10*3/uL (ref 0.1–1.0)
Monocytes Relative: 11.3 % (ref 3.0–12.0)
Neutro Abs: 2.3 10*3/uL (ref 1.4–7.7)
RBC: 4.24 Mil/uL (ref 3.87–5.11)
RDW: 13.9 % (ref 11.5–14.6)

## 2013-09-12 LAB — HEPATIC FUNCTION PANEL
ALT: 25 U/L (ref 0–35)
AST: 24 U/L (ref 0–37)
Albumin: 4.1 g/dL (ref 3.5–5.2)
Alkaline Phosphatase: 67 U/L (ref 39–117)
Total Bilirubin: 0.6 mg/dL (ref 0.3–1.2)
Total Protein: 7.3 g/dL (ref 6.0–8.3)

## 2013-09-12 LAB — POCT URINALYSIS DIPSTICK
Bilirubin, UA: NEGATIVE
Glucose, UA: NEGATIVE
Nitrite, UA: NEGATIVE
Protein, UA: NEGATIVE
Spec Grav, UA: >=1.03
Urobilinogen, UA: 0.2
pH, UA: 5

## 2013-09-12 LAB — TSH: TSH: 1.7 u[IU]/mL (ref 0.35–5.50)

## 2013-09-12 LAB — LDL CHOLESTEROL, DIRECT: Direct LDL: 124.2 mg/dL

## 2013-09-12 LAB — LIPID PANEL
Total CHOL/HDL Ratio: 4
Triglycerides: 257 mg/dL — ABNORMAL HIGH (ref 0.0–149.0)

## 2013-09-12 LAB — BASIC METABOLIC PANEL
Calcium: 9.5 mg/dL (ref 8.4–10.5)
Creatinine, Ser: 0.8 mg/dL (ref 0.4–1.2)
GFR: 80.06 mL/min (ref >60.00)

## 2013-09-13 ENCOUNTER — Other Ambulatory Visit: Payer: Self-pay | Admitting: Family Medicine

## 2013-09-14 ENCOUNTER — Other Ambulatory Visit: Payer: Self-pay | Admitting: Family Medicine

## 2013-09-21 ENCOUNTER — Encounter: Payer: Self-pay | Admitting: Family Medicine

## 2013-09-21 ENCOUNTER — Ambulatory Visit (INDEPENDENT_AMBULATORY_CARE_PROVIDER_SITE_OTHER): Payer: Commercial Managed Care - HMO | Admitting: Family Medicine

## 2013-09-21 VITALS — BP 120/84 | Temp 98.9°F | Ht 59.0 in | Wt 149.0 lb

## 2013-09-21 DIAGNOSIS — Z23 Encounter for immunization: Secondary | ICD-10-CM

## 2013-09-21 DIAGNOSIS — Z0000 Encounter for general adult medical examination without abnormal findings: Secondary | ICD-10-CM

## 2013-09-21 DIAGNOSIS — F329 Major depressive disorder, single episode, unspecified: Secondary | ICD-10-CM

## 2013-09-21 DIAGNOSIS — R569 Unspecified convulsions: Secondary | ICD-10-CM

## 2013-09-21 DIAGNOSIS — G2581 Restless legs syndrome: Secondary | ICD-10-CM

## 2013-09-21 DIAGNOSIS — N952 Postmenopausal atrophic vaginitis: Secondary | ICD-10-CM

## 2013-09-21 DIAGNOSIS — M899 Disorder of bone, unspecified: Secondary | ICD-10-CM

## 2013-09-21 MED ORDER — ESTROGENS, CONJUGATED 0.625 MG/GM VA CREA: 1.0000 g | TOPICAL_CREAM | Freq: Every day | VAGINAL | Status: DC

## 2013-09-21 MED ORDER — FLUOXETINE HCL 20 MG PO CAPS: ORAL_CAPSULE | ORAL | Status: DC

## 2013-09-21 MED ORDER — PRAMIPEXOLE DIHYDROCHLORIDE 0.5 MG PO TABS: 0.5000 mg | ORAL_TABLET | ORAL | Status: DC

## 2013-09-21 MED ORDER — PHENYTOIN SODIUM EXTENDED 100 MG PO CAPS: ORAL_CAPSULE | ORAL | Status: DC

## 2013-09-21 NOTE — Progress Notes (Signed)
  Subjective:    Patient ID: Jennifer Mcneil, female    DOB: 11/12/1946, 67 y.o.   MRN: [id number]  HPI Sophronia is a 67 year old female nonsmoker who comes in today for general Medicare wellness examination because of a history of a seizure disorder, mild depression, postmenopausal vaginal dryness, restless leg syndrome  She gets routine eye care, dental care, BSE monthly, and you mammography, colonoscopy normal, vaccinations tetanus and flu shot today. Information given on shingles  Cognitive function normal she still works for a [employer] systems [employer]  She walks on a regular basis home health safety reviewed no issues identified, no guns in the house, she does have a health care power of attorney and living well   Review of Systems  Constitutional: Negative.   HENT: Negative.   Eyes: Negative.   Respiratory: Negative.   Cardiovascular: Negative.   Gastrointestinal: Negative.   Genitourinary: Negative.   Musculoskeletal: Negative.   Neurological: Negative.   Psychiatric/Behavioral: Negative.        Objective:   Physical Exam  Constitutional: She appears well-developed and well-nourished.  HENT:  Head: Normocephalic and atraumatic.  Right Ear: External ear normal.  Left Ear: External ear normal.  Nose: Nose normal.  Mouth/Throat: Oropharynx is clear and moist.  Eyes: EOM are normal. Pupils are equal, round, and reactive to light.  Neck: Normal range of motion. Neck supple. No thyromegaly present.  Cardiovascular: Normal rate, regular rhythm, normal heart sounds and intact distal pulses.  Exam reveals no gallop and no friction rub.   No murmur heard. Pulmonary/Chest: Effort normal and breath sounds normal.  Abdominal: Soft. Bowel sounds are normal. She exhibits no distension and no mass. There is no tenderness. There is no rebound.  Genitourinary:  Bilateral breast exam normal  She had a TAH and BSO at age 58 for fibroids  Musculoskeletal: Normal range of motion.   Lymphadenopathy:    She has no cervical adenopathy.  Neurological: She is alert. She has normal reflexes. No cranial nerve deficit. She exhibits normal muscle tone. Coordination normal.  Skin: Skin is warm and dry.  Psychiatric: She has a normal mood and affect. Her behavior is normal. Judgment and thought content normal.          Assessment & Plan:  Healthy female  History of seizure disorder check Dilantin level  Postmenopausal vaginal dryness vaginal cream when necessary  History of mild depression continue Prozac 20 mg daily  Restless leg syndrome continue Mirapex 0.5 each bedtime

## 2013-09-21 NOTE — Patient Instructions (Signed)
Continue your current medications  I will call you with your lab work results........ if we have to change any of your medications we can do it by phone  Walk 30 minutes daily  Return in one year for general medical exam sooner if any problems

## 2013-09-22 LAB — PHENYTOIN LEVEL, TOTAL: Phenytoin Lvl: 8.5 ug/mL — ABNORMAL LOW (ref 10.0–20.0)

## 2013-09-23 ENCOUNTER — Telehealth: Payer: Self-pay | Admitting: Family Medicine

## 2013-09-23 NOTE — Telephone Encounter (Signed)
Pt would like you to text the results of her labs to her

## 2013-09-23 NOTE — Telephone Encounter (Signed)
Unable to text at this time. Left message on machine for patient

## 2013-09-27 ENCOUNTER — Other Ambulatory Visit: Payer: Self-pay | Admitting: Family Medicine

## 2013-09-27 DIAGNOSIS — R569 Unspecified convulsions: Secondary | ICD-10-CM

## 2013-10-11 ENCOUNTER — Other Ambulatory Visit (INDEPENDENT_AMBULATORY_CARE_PROVIDER_SITE_OTHER): Payer: Medicare HMO

## 2013-10-11 DIAGNOSIS — R569 Unspecified convulsions: Secondary | ICD-10-CM

## 2013-10-11 NOTE — Patient Instructions (Signed)
n

## 2013-10-12 LAB — PHENYTOIN LEVEL, TOTAL: Phenytoin Lvl: 27 ug/mL — ABNORMAL HIGH (ref 10.0–20.0)

## 2013-10-19 ENCOUNTER — Encounter: Payer: Self-pay | Admitting: *Deleted

## 2013-10-19 ENCOUNTER — Other Ambulatory Visit: Payer: Self-pay | Admitting: Family Medicine

## 2013-10-19 DIAGNOSIS — R569 Unspecified convulsions: Secondary | ICD-10-CM

## 2013-11-21 ENCOUNTER — Other Ambulatory Visit (INDEPENDENT_AMBULATORY_CARE_PROVIDER_SITE_OTHER): Payer: Medicare HMO

## 2013-11-21 DIAGNOSIS — R569 Unspecified convulsions: Secondary | ICD-10-CM

## 2013-11-22 LAB — PHENYTOIN LEVEL, TOTAL: Phenytoin Lvl: 37.4 ug/mL — ABNORMAL HIGH (ref 10.0–20.0)

## 2013-11-23 ENCOUNTER — Other Ambulatory Visit: Payer: Self-pay | Admitting: Family Medicine

## 2013-11-23 DIAGNOSIS — R569 Unspecified convulsions: Secondary | ICD-10-CM

## 2014-07-12 ENCOUNTER — Telehealth: Payer: Self-pay | Admitting: Family Medicine

## 2014-07-12 DIAGNOSIS — M79671 Pain in right foot: Secondary | ICD-10-CM

## 2014-07-12 DIAGNOSIS — M79672 Pain in left foot: Principal | ICD-10-CM

## 2014-07-12 NOTE — Telephone Encounter (Signed)
Pt called to ask for a referral to see a podiatrist . Would like for Dr Sherren Mocha to recommend someone

## 2014-07-12 NOTE — Telephone Encounter (Signed)
Type of Insurance: ° °Do we have a copy of Insurance Card on File? ° °Patient's PCP: ° °PCP's NPI: ° °Treating Provider: ° °Treating Provider's NPI: ° °Treating Provider's Phone Number: ° °Treating Provider's Fax Number: ° °Reason for Visit (diagnosis): ° °

## 2014-07-13 ENCOUNTER — Other Ambulatory Visit: Payer: Self-pay

## 2014-07-13 DIAGNOSIS — Z1231 Encounter for screening mammogram for malignant neoplasm of breast: Secondary | ICD-10-CM

## 2014-07-21 ENCOUNTER — Ambulatory Visit
Admission: RE | Admit: 2014-07-21 | Discharge: 2014-07-21 | Disposition: A | Discharge status: Home / Self Care | Payer: Commercial Managed Care - HMO | Source: Ambulatory Visit

## 2014-07-21 DIAGNOSIS — Z1231 Encounter for screening mammogram for malignant neoplasm of breast: Secondary | ICD-10-CM

## 2014-08-02 ENCOUNTER — Ambulatory Visit (INDEPENDENT_AMBULATORY_CARE_PROVIDER_SITE_OTHER): Payer: Commercial Managed Care - HMO | Admitting: Podiatrist

## 2014-08-02 ENCOUNTER — Ambulatory Visit (INDEPENDENT_AMBULATORY_CARE_PROVIDER_SITE_OTHER): Payer: Commercial Managed Care - HMO

## 2014-08-02 ENCOUNTER — Encounter: Payer: Self-pay | Admitting: Podiatrist

## 2014-08-02 VITALS — BP 109/63 | HR 75 | Resp 16

## 2014-08-02 DIAGNOSIS — M722 Plantar fascial fibromatosis: Secondary | ICD-10-CM

## 2014-08-02 MED ORDER — TRIAMCINOLONE ACETONIDE 40 MG/ML IJ SUSP: 20.0000 mg | Freq: Once | INTRAMUSCULAR | Status: AC

## 2014-08-02 NOTE — Progress Notes (Signed)
   Subjective:    Patient ID: Jennifer Mcneil, female    DOB: [birth year], 68 y.o.   MRN: [id number]  HPI Comments: "I have heel pain"  Patient c/o aching plantar heel left for about 2 months. She does have AM pain. Sharp pains occasionally, just sitting down. Tried ice at night and stretching some-no help.  Foot Pain      Review of Systems  All other systems reviewed and are negative.      Objective:   Physical Exam GENERAL APPEARANCE: Alert, conversant. Appropriately groomed. No acute distress.  VASCULAR: Pedal pulses palpable and strong bilateral.  Capillary refill time is immediate to all digits,  Proximal to distal cooling it warm to warm.  Digital hair growth is present bilateral  NEUROLOGIC: sensation is intact epicritically and protectively to 5.07 monofilament at 5/5 sites bilateral.  Light touch is intact bilateral, vibratory sensation intact bilateral, achilles tendon reflex is intact bilateral.  Negative Tinel sign is elicited MUSCULOSKELETAL: Pain on palpation plantar medial aspect left foot  at insertion of plantar fascia on the medial calcaneal tubercle. Inflammation at the insertion of the plantar fascia is present. Rectus foot type is seen. DERMATOLOGIC: skin color, texture, and turger are within normal limits.  No preulcerative lesions are seen, no interdigital maceration noted.  No open lesions present.  Digital nails are asymptomatic.      Assessment & Plan:  Plantar fasciitis left  Injected the left heel with Kenalog and Marcaine mixture. Recommended a plantar fascial strapping. Also recommended stretching and shoe gear changes. I will see her back in 3 weeks or as needed for followup.

## 2014-08-02 NOTE — Patient Instructions (Signed)

## 2014-09-18 ENCOUNTER — Other Ambulatory Visit (INDEPENDENT_AMBULATORY_CARE_PROVIDER_SITE_OTHER): Payer: Commercial Managed Care - HMO

## 2014-09-18 DIAGNOSIS — Z0000 Encounter for general adult medical examination without abnormal findings: Secondary | ICD-10-CM

## 2014-09-18 DIAGNOSIS — R569 Unspecified convulsions: Secondary | ICD-10-CM

## 2014-09-18 LAB — POCT URINALYSIS DIPSTICK
Bilirubin, UA: NEGATIVE
Blood, UA: NEGATIVE
Glucose, UA: NEGATIVE
Ketones, UA: NEGATIVE
Nitrite, UA: NEGATIVE
PH UA: 5
Protein, UA: NEGATIVE
SPEC GRAV UA: 1.02
UROBILINOGEN UA: 0.2

## 2014-09-18 LAB — HEPATIC FUNCTION PANEL
ALBUMIN: 3.8 g/dL (ref 3.5–5.2)
ALT: 16 U/L (ref 0–35)
AST: 19 U/L (ref 0–37)
Alkaline Phosphatase: 74 U/L (ref 39–117)
Bilirubin, Direct: 0 mg/dL (ref 0.0–0.3)
TOTAL PROTEIN: 7.9 g/dL (ref 6.0–8.3)
Total Bilirubin: 0.3 mg/dL (ref 0.2–1.2)

## 2014-09-18 LAB — LIPID PANEL
CHOLESTEROL: 238 mg/dL — AB (ref 0–200)
HDL: 68.7 mg/dL (ref >39.00)
LDL Cholesterol: 131 mg/dL — ABNORMAL HIGH (ref 0–99)
NONHDL: 169.3
Total CHOL/HDL Ratio: 3
Triglycerides: 191 mg/dL — ABNORMAL HIGH (ref 0.0–149.0)
VLDL: 38.2 mg/dL (ref 0.0–40.0)

## 2014-09-18 LAB — BASIC METABOLIC PANEL
BUN: 17 mg/dL (ref 6–23)
CHLORIDE: 107 meq/L (ref 96–112)
CO2: 31 mEq/L (ref 19–32)
Calcium: 9.8 mg/dL (ref 8.4–10.5)
Creatinine, Ser: 0.9 mg/dL (ref 0.4–1.2)
GFR: 66.53 mL/min (ref >60.00)
Glucose, Bld: 103 mg/dL — ABNORMAL HIGH (ref 70–99)
Potassium: 4.8 mEq/L (ref 3.5–5.1)
SODIUM: 144 meq/L (ref 135–145)

## 2014-09-18 LAB — CBC WITH DIFFERENTIAL/PLATELET
BASOS PCT: 0.7 % (ref 0.0–3.0)
Basophils Absolute: 0 10*3/uL (ref 0.0–0.1)
EOS PCT: 3.9 % (ref 0.0–5.0)
Eosinophils Absolute: 0.2 10*3/uL (ref 0.0–0.7)
HEMATOCRIT: 40.3 % (ref 36.0–46.0)
Hemoglobin: 13.5 g/dL (ref 12.0–15.0)
Lymphocytes Relative: 39.5 % (ref 12.0–46.0)
Lymphs Abs: 2.4 10*3/uL (ref 0.7–4.0)
MCHC: 33.5 g/dL (ref 30.0–36.0)
MCV: 91 fl (ref 78.0–100.0)
MONO ABS: 0.7 10*3/uL (ref 0.1–1.0)
Monocytes Relative: 12.3 % — ABNORMAL HIGH (ref 3.0–12.0)
Neutro Abs: 2.6 10*3/uL (ref 1.4–7.7)
Neutrophils Relative %: 43.6 % (ref 43.0–77.0)
Platelets: 247 10*3/uL (ref 150.0–400.0)
RBC: 4.43 Mil/uL (ref 3.87–5.11)
RDW: 13.8 % (ref 11.5–15.5)
WBC: 6 10*3/uL (ref 4.0–10.5)

## 2014-09-18 LAB — TSH: TSH: 1.29 u[IU]/mL (ref 0.35–4.50)

## 2014-09-19 LAB — PHENYTOIN LEVEL, TOTAL: PHENYTOIN LVL: 9.1 ug/mL — AB (ref 10.0–20.0)

## 2014-09-25 ENCOUNTER — Ambulatory Visit (INDEPENDENT_AMBULATORY_CARE_PROVIDER_SITE_OTHER): Payer: Commercial Managed Care - HMO | Admitting: Family Medicine

## 2014-09-25 ENCOUNTER — Encounter: Payer: Self-pay | Admitting: Family Medicine

## 2014-09-25 VITALS — BP 120/84 | Temp 98.8°F | Ht 58.25 in | Wt 150.0 lb

## 2014-09-25 DIAGNOSIS — F329 Major depressive disorder, single episode, unspecified: Secondary | ICD-10-CM

## 2014-09-25 DIAGNOSIS — Z23 Encounter for immunization: Secondary | ICD-10-CM

## 2014-09-25 DIAGNOSIS — R1314 Dysphagia, pharyngoesophageal phase: Secondary | ICD-10-CM

## 2014-09-25 DIAGNOSIS — F32A Depression, unspecified: Secondary | ICD-10-CM

## 2014-09-25 MED ORDER — PRAMIPEXOLE DIHYDROCHLORIDE 0.5 MG PO TABS: 0.5000 mg | ORAL_TABLET | Freq: Every day | ORAL | Status: DC

## 2014-09-25 MED ORDER — FLUOXETINE HCL 20 MG PO CAPS: ORAL_CAPSULE | ORAL | Status: DC

## 2014-09-25 MED ORDER — PHENYTOIN SODIUM EXTENDED 100 MG PO CAPS: 100.0000 mg | ORAL_CAPSULE | Freq: Two times a day (BID) | ORAL | Status: DC

## 2014-09-25 NOTE — Progress Notes (Signed)
   Subjective:    Patient ID: Jennifer Mcneil, female    DOB: 1946/03/03, 68 y.o.   MRN: [id number]  HPI [name] is a 68 year old female single nonsmoker who comes in today for evaluation of seizure disorder and restless leg syndrome  On 200 mg of Dilantin daily her Dilantin levels 9.1%. Ideal levels 15. However on 300 mg daily levels too high. She wishes to stay here she feels well no seizures  She has a history of restless leg syndrome is on Mirapex 0.5 daily  She also has history of mild depression on Prozac daily.  She gets routine eye care, dental care, BSE monthly, and you mammography, last colonoscopies 2000. Asked her to call GI get set up for follow-up colonoscopy.  A new problem as food getting stuck in her esophagus for the past 6 months.  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   HENT: Negative.   Eyes: Negative.   Respiratory: Negative.   Cardiovascular: Negative.   Gastrointestinal: Negative.   Endocrine: Negative.   Genitourinary: Negative.   Musculoskeletal: Negative.   Skin: Negative.   Allergic/Immunologic: Negative.   Neurological: Negative.   Hematological: Negative.   Psychiatric/Behavioral: Negative.        Objective:   Physical Exam  Constitutional: She appears well-developed and well-nourished.  HENT:  Head: Normocephalic and atraumatic.  Right Ear: External ear normal.  Left Ear: External ear normal.  Nose: Nose normal.  Mouth/Throat: Oropharynx is clear and moist.  Eyes: EOM are normal. Pupils are equal, round, and reactive to light.  Neck: Normal range of motion. Neck supple. No JVD present. No tracheal deviation present. No thyromegaly present.  Cardiovascular: Normal rate, regular rhythm, normal heart  sounds and intact distal pulses.  Exam reveals no gallop and no friction rub.   No murmur heard. Pulmonary/Chest: Effort normal and breath sounds normal. No stridor. No respiratory distress. She has no wheezes. She has no rales. She exhibits no tenderness.  Abdominal: Soft. Bowel sounds are normal. She exhibits no distension and no mass. There is no tenderness. There is no rebound and no guarding.  Genitourinary:  Bilateral breast exam normal  She had her uterus and ovaries removed in her mid 42s for fibroids therefore pelvic and Pap not indicated  Musculoskeletal: Normal range of motion.  Lymphadenopathy:    She has no cervical adenopathy.  Neurological: She is alert. She has normal reflexes. No cranial nerve deficit. She exhibits normal muscle tone. Coordination normal.  Skin: Skin is warm and dry. No rash noted. No erythema. No pallor.  Psychiatric: She has a normal mood and affect. Her behavior is normal. Judgment and thought content normal.  Nursing note reviewed.         Assessment & Plan:  . Seizure disorder......... Continue Dilantin 200 mg daily  Restless leg syndrome....... Continue Mirapex 0.5 daily at bedtime  Mild depression continue Prozac 20 mg daily  Dysphagia................. Soft diet....... GI consult ASAP

## 2014-09-25 NOTE — Patient Instructions (Signed)
Continue current medications  Follow-up in 1 year sooner if any problems  Stay on a soft liquid type diet  We will get you set up a GI consult ASAP

## 2014-09-25 NOTE — Progress Notes (Signed)
Pre visit review using our clinic review tool, if applicable. No additional management support is needed unless otherwise documented below in the visit note. 

## 2014-09-26 ENCOUNTER — Encounter: Payer: Self-pay | Admitting: Internal Medicine

## 2014-10-04 ENCOUNTER — Other Ambulatory Visit: Payer: Self-pay | Admitting: Family Medicine

## 2014-10-05 ENCOUNTER — Encounter: Payer: Self-pay | Admitting: *Deleted

## 2014-11-21 ENCOUNTER — Other Ambulatory Visit: Payer: Self-pay | Admitting: Family Medicine

## 2014-12-01 ENCOUNTER — Ambulatory Visit: Payer: Commercial Managed Care - HMO | Admitting: Internal Medicine

## 2015-02-07 ENCOUNTER — Ambulatory Visit (INDEPENDENT_AMBULATORY_CARE_PROVIDER_SITE_OTHER): Payer: Commercial Managed Care - HMO | Admitting: Internal Medicine

## 2015-02-07 ENCOUNTER — Encounter: Payer: Self-pay | Admitting: Internal Medicine

## 2015-02-07 VITALS — BP 122/60 | HR 68 | Ht 58.5 in | Wt 153.0 lb

## 2015-02-07 DIAGNOSIS — R1314 Dysphagia, pharyngoesophageal phase: Secondary | ICD-10-CM | POA: Diagnosis not present

## 2015-02-07 DIAGNOSIS — K219 Gastro-esophageal reflux disease without esophagitis: Secondary | ICD-10-CM

## 2015-02-07 NOTE — Patient Instructions (Addendum)
Dr Sherren Mocha.  You have been scheduled for an endoscopy. Please follow written instructions given to you at your visit today. If you use inhalers (even only as needed), please bring them with you on the day of your procedure. Your physician has requested that you go to www.startemmi.com and enter the access code given to you at your visit today. This web site gives a general overview about your procedure. However, you should still follow specific instructions given to you by our office regarding your preparation for the procedure.

## 2015-02-07 NOTE — Progress Notes (Signed)
Jennifer Mcneil 21-Sep-1946 [id number]  Note: This dictation was prepared with Dragon digital system. Any transcriptional errors that result from this procedure are unintentional.   History of Present Illness: This is a 69 year old white female with 2 year history of gastroesophageal reflux and most recent development of  solid food dysphagia. It became worse about 2 months ago but then she started on Nexium 20 mg daily which resulted in marked improvement of her symptoms. She has occasional regurgitation of acid and food at night but she denies coughing. She has been under a lot of stress due to her 27 year old mother living with her, recently placed in a nursing home. Patient had a screening colonoscopy in 2000 which was normal. She is due for recall colonoscopy     Past Medical History  Diagnosis Date  . Diabetes mellitus   . Osteopenia   . Seizure disorder   . Eczema DYS  . Restless leg syndrome     Past Surgical History  Procedure Laterality Date  . Abdominal hysterectomy      No Known Allergies  Family history and social history have been reviewed.  Review of Systems: Occasional solid food dysphagia. Denies cough. Denies abdominal pain. Positive for bloating and indigestion  The remainder of the 10 point ROS is negative except as outlined in the H&P  Physical Exam: General Appearance Well developed, in no distress, mildly overweight Eyes  Non icteric  HEENT  Non traumatic, normocephalic , normal voice Mouth No lesion, tongue papillated, no cheilosis Neck Supple without adenopathy, thyroid not enlarged, no carotid bruits, no JVD Lungs Clear to auscultation bilaterally COR Normal S1, normal S2, regular rhythm, no murmur, quiet precordium Abdomen soft nontender with normoactive bowel sounds Rectal not done Extremities  No pedal edema Skin No lesions Neurological Alert and oriented x 3 Psychological Normal mood and affect  Assessment and Plan:   69 year old white  female with gastroesophageal reflux and an intermittent solid food dysphagia improved on PPI. I suspect  mild esophageal stricture as well as possible reflux esophagitis. We will proceed with upper endoscopy and possible dilation. We need to rule out Barrett's esophagus. She will follow antireflux measures Colorectal screening. She will be due for colonoscopy but is unable to schedule at this point because she works for [employer] and it has been very busy during tax season     Jennifer Mcneil 02/07/2015

## 2015-03-16 ENCOUNTER — Ambulatory Visit (AMBULATORY_SURGERY_CENTER): Payer: Commercial Managed Care - HMO | Admitting: Internal Medicine

## 2015-03-16 ENCOUNTER — Encounter: Payer: Self-pay | Admitting: Internal Medicine

## 2015-03-16 VITALS — BP 102/68 | HR 69 | Temp 96.6°F | Resp 22 | Ht 58.5 in | Wt 153.0 lb

## 2015-03-16 DIAGNOSIS — R1310 Dysphagia, unspecified: Secondary | ICD-10-CM

## 2015-03-16 DIAGNOSIS — R1314 Dysphagia, pharyngoesophageal phase: Secondary | ICD-10-CM

## 2015-03-16 DIAGNOSIS — K222 Esophageal obstruction: Secondary | ICD-10-CM

## 2015-03-16 DIAGNOSIS — R1319 Other dysphagia: Secondary | ICD-10-CM

## 2015-03-16 DIAGNOSIS — E669 Obesity, unspecified: Secondary | ICD-10-CM | POA: Diagnosis not present

## 2015-03-16 DIAGNOSIS — G40909 Epilepsy, unspecified, not intractable, without status epilepticus: Secondary | ICD-10-CM | POA: Diagnosis not present

## 2015-03-16 DIAGNOSIS — E119 Type 2 diabetes mellitus without complications: Secondary | ICD-10-CM | POA: Diagnosis not present

## 2015-03-16 MED ORDER — SODIUM CHLORIDE 0.9 % IV SOLN: 500.0000 mL | INTRAVENOUS | Status: DC

## 2015-03-16 NOTE — Patient Instructions (Signed)
YOU HAD AN ENDOSCOPIC PROCEDURE TODAY AT [facility]:   Refer to the procedure report that was given to you for any specific questions about what was found during the examination.  If the procedure report does not answer your questions, please call your gastroenterologist to clarify.  If you requested that your care partner not be given the details of your procedure findings, then the procedure report has been included in a sealed envelope for you to review at your convenience later.  YOU SHOULD EXPECT: Some feelings of bloating in the abdomen. Passage of more gas than usual.  Walking can help get rid of the air that was put into your GI tract during the procedure and reduce the bloating.  Please Note:  You might notice some irritation and congestion in your nose or some drainage.  This is from the oxygen used during your procedure.  There is no need for concern and it should clear up in a day or so.  SYMPTOMS TO REPORT IMMEDIATELY:   Following upper endoscopy (EGD)  Vomiting of blood or coffee ground material  New chest pain or pain under the shoulder blades  Painful or persistently difficult swallowing  New shortness of breath  Fever of 100F or higher  Black, tarry-looking stools  For urgent or emergent issues, a gastroenterologist can be reached at any hour by calling [phone].  DIET: follow dilation diet today- see handout.  Drink plenty of fluids but you should avoid alcoholic beverages for 24 hours.  ACTIVITY:  You should plan to take it easy for the rest of today and you should NOT DRIVE or use heavy machinery until tomorrow (because of the sedation medicines used during the test).    FOLLOW UP: Our staff will call the number listed on your records the next business day following your procedure to check on you and address any questions or concerns that you may have regarding the information given to you following your procedure. If we do not reach you, we will  leave a message.  However, if you are feeling well and you are not experiencing any problems, there is no need to return our call.  We will assume that you have returned to your regular daily activities without incident.  SIGNATURES/CONFIDENTIALITY: You and/or your care partner have signed paperwork which will be entered into your electronic medical record.  These signatures attest to the fact that that the information above on your After Visit Summary has been reviewed and is understood.  Full responsibility of the confidentiality of this discharge information lies with you and/or your care-partner.  Please read handouts given- anti-reflux regimen, stricture and dilation diet  Continue your normal medications

## 2015-03-16 NOTE — Progress Notes (Signed)
To recovery, report to [place], RN, VSS 

## 2015-03-16 NOTE — Progress Notes (Signed)
Called to room to assist during endoscopic procedure.  Patient ID and intended procedure confirmed with present staff. Received instructions for my participation in the procedure from the performing physician.  

## 2015-03-16 NOTE — Op Note (Signed)
[place]  [address] [place], [address]   ENDOSCOPY PROCEDURE REPORT  PATIENT: Jennifer, Mcneil  MR#: [id number] BIRTHDATE: July 19, 1946 , 69  yrs. old GENDER: female ENDOSCOPIST: Lafayette Dragon, MD REFERRED BY:  Dorena Cookey, M.D. PROCEDURE DATE:  03/16/2015 PROCEDURE:  EGD w/ wire guided (savary) dilation ASA CLASS:     Class II INDICATIONS:  dysphagia and History of gastroesophageal reflux. Recent onset of dysphagia to solids.Marland Kitchen MEDICATIONS: Monitored anesthesia care and Propofol 200 mg IV TOPICAL ANESTHETIC: none  DESCRIPTION OF PROCEDURE: After the risks benefits and alternatives of the procedure were thoroughly explained, informed consent was obtained.  The LB [id number] endoscope was introduced through the mouth and advanced to the second portion of the duodenum , Without limitations.  The instrument was slowly withdrawn as the mucosa was fully examined.    Esophagus:  esophagus was intubated without difficulty.  Mucosa of the proximal, mid and distal esophagus was normal. There was a benign appearing mild distal esophageal stricture at 35 cm from the incisors  at the g-e junction which allowed the endoscope to traverse into small hiatal hernia. There was no evidence of esophagitis Stomach: Stomach was insufflated with air and and  showed small 2 cm hiatal hernia. There were no Cameron erosions. Gastric antrum and pyloric outlet was unremarkable. Retroflexion of the endoscope revealed small hiatal hernia  Duodenum: duodenal bulb and descending duodenum was normal. Savary dilators were used to dilate the distal esophageal stricture starting with 14, 15, 16 and 17 mm dilators. No blood on the last dilator.[         The scope was then withdrawn from the patient and the procedure completed.  COMPLICATIONS: There were no immediate complications.  ENDOSCOPIC IMPRESSION: 1. mild benign-appearing distal esophageal stricture 2. Status post  dilation with Savary dilator from 14-17 mm 3. Small reducible hiatal hernia  RECOMMENDATIONS: 1. continue PPI 2. Antireflux measures 3. Repeated dilation when necessary dysphagia  REPEAT EXAM: prn  eSigned:  Lafayette Dragon, MD 03/16/2015 10:21 AM    CC:  PATIENT NAME:  Jennifer, Mcneil MR#: [id number]

## 2015-03-19 ENCOUNTER — Telehealth: Payer: Self-pay

## 2015-03-19 NOTE — Telephone Encounter (Signed)
  Follow up Call-  Call back number 03/16/2015  Post procedure Call Back phone  # [phone]  Permission to leave phone message Yes     Patient questions:  Do you have a fever, pain , or abdominal swelling? No. Pain Score  0 *  Have you tolerated food without any problems? Yes.    Have you been able to return to your normal activities? Yes.    Do you have any questions about your discharge instructions: Diet   No. Medications  No. Follow up visit  No.  Do you have questions or concerns about your Care? No.  Actions: * If pain score is 4 or above: No action needed, pain <4.

## 2015-05-17 ENCOUNTER — Ambulatory Visit (INDEPENDENT_AMBULATORY_CARE_PROVIDER_SITE_OTHER): Payer: Commercial Managed Care - HMO | Admitting: Adult Health

## 2015-05-17 ENCOUNTER — Encounter: Payer: Self-pay | Admitting: Adult Health

## 2015-05-17 VITALS — BP 112/78 | Temp 98.3°F | Ht 58.5 in | Wt 156.6 lb

## 2015-05-17 DIAGNOSIS — T148 Other injury of unspecified body region: Secondary | ICD-10-CM | POA: Diagnosis not present

## 2015-05-17 DIAGNOSIS — T148XXA Other injury of unspecified body region, initial encounter: Secondary | ICD-10-CM | POA: Insufficient documentation

## 2015-05-17 NOTE — Patient Instructions (Addendum)
Continue to wear the wrap when you are moving around. Ice, elevation and Ibuprofen 600mg . Follow up as needed  Foot Contusion A foot contusion is a deep bruise to the foot. Contusions are the result of an injury that caused bleeding under the skin. The contusion may turn blue, purple, or yellow. Minor injuries will give you a painless contusion, but more severe contusions may stay painful and swollen for a few weeks. CAUSES  A foot contusion comes from a direct blow to that area, such as a heavy object falling on the foot. SYMPTOMS   Swelling of the foot.  Discoloration of the foot.  Tenderness or soreness of the foot. DIAGNOSIS  You will have a physical exam and will be asked about your history. You may need an X-ray of your foot to look for a broken bone (fracture).  TREATMENT  An elastic wrap may be recommended to support your foot. Resting, elevating, and applying cold compresses to your foot are often the best treatments for a foot contusion. Over-the-counter medicines may also be recommended for pain control. HOME CARE INSTRUCTIONS   Put ice on the injured area.  Put ice in a plastic bag.  Place a towel between your skin and the bag.  Leave the ice on for 15-20 minutes, 03-04 times a day.  Only take over-the-counter or prescription medicines for pain, discomfort, or fever as directed by your caregiver.  If told, use an elastic wrap as directed. This can help reduce swelling. You may remove the wrap for sleeping, showering, and bathing. If your toes become numb, cold, or blue, take the wrap off and reapply it more loosely.  Elevate your foot with pillows to reduce swelling.  Try to avoid standing or walking while the foot is painful. Do not resume use until instructed by your caregiver. Then, begin use gradually. If pain develops, decrease use. Gradually increase activities that do not cause discomfort until you have normal use of your foot.  See your caregiver as directed. It  is very important to keep all follow-up appointments in order to avoid any lasting problems with your foot, including long-term (chronic) pain. SEEK IMMEDIATE MEDICAL CARE IF:   You have increased redness, swelling, or pain in your foot.  Your swelling or pain is not relieved with medicines.  You have loss of feeling in your foot or are unable to move your toes.  Your foot turns cold or blue.  You have pain when you move your toes.  Your foot becomes warm to the touch.  Your contusion does not improve in 2 days. MAKE SURE YOU:   Understand these instructions.  Will watch your condition.  Will get help right away if you are not doing well or get worse. Document Released: 09/01/2006 Document Revised: 05/11/2012 Document Reviewed: 10/14/2011 [facility] Patient Information 2015 [place], [place]. This information is not intended to replace advice given to you by your health care provider. Make sure you discuss any questions you have with your health care provider.

## 2015-05-17 NOTE — Progress Notes (Signed)
Subjective:    Patient ID: Jennifer Mcneil, female    DOB: Nov 18, 1946, 69 y.o.   MRN: [id number]  HPI  [name] presents to the office today for right foot pain. Three months ago she had a piece of furniture fall on the part of foot that hurts.  For the past two months she has had "shooting" pain in the lateral side of her right foot.   Pain is intermittant. Sometimes she has pain when she walks and sometimes it doesn't hurt when she walks. The same for when sitting.   She has taken Aleve without relief.    Review of Systems  Constitutional: Negative.   Musculoskeletal: Positive for myalgias and arthralgias. Negative for joint swelling.  Skin: Negative for color change, rash and wound.  All other systems reviewed and are negative.  Past Medical History  Diagnosis Date  . Osteopenia   . Seizure disorder   . Eczema DYS  . Restless leg syndrome     History   Social History  . Marital Status: Single    Spouse Name: N/A  . Number of Children: N/A  . Years of Education: N/A   Occupational History  . Not on file.   Social History Main Topics  . Smoking status: Never Smoker   . Smokeless tobacco: Never Used  . Alcohol Use: Yes     Comment: occasionally  . Drug Use: No  . Sexual Activity: Not on file   Other Topics Concern  . Not on file   Social History Narrative    Past Surgical History  Procedure Laterality Date  . Abdominal hysterectomy      No family history on file.  No Known Allergies  Current Outpatient Prescriptions on File Prior to Visit  Medication Sig Dispense Refill  . Ascorbic Acid (VITAMIN C) 100 MG tablet Take 100 mg by mouth daily.      Marland Kitchen FLUoxetine (PROZAC) 20 MG capsule TAKE 1 CAPSULE (20 MG TOTAL) BY MOUTH DAILY. 100 capsule 3  . GRAPE SEED EXTRACT PO Take by mouth.      . phenytoin (DILANTIN) 100 MG ER capsule Take 1 capsule (100 mg total) by mouth 2 (two) times daily. 2 tabs daily 200 capsule 3  . pramipexole (MIRAPEX) 0.5 MG tablet Take  1 tablet (0.5 mg total) by mouth at bedtime. 100 tablet 3   Current Facility-Administered Medications on File Prior to Visit  Medication Dose Route Frequency Provider Last Rate Last Dose  . triamcinolone acetonide (KENALOG-40) injection 20 mg  20 mg Other Once [employer], DPM        BP 112/78 mmHg  Temp(Src) 98.3 F (36.8 C) (Oral)  Ht 4' 10.5" (1.486 m)  Wt 156 lb 9.6 oz (71.033 kg)  BMI 32.17 kg/m2       Objective:   Physical Exam  Constitutional: She is oriented to person, place, and time. She appears well-developed and well-nourished. No distress.  Musculoskeletal: Normal range of motion. She exhibits tenderness. She exhibits no edema.  Tenderness to palpation on lateral aspect of right foot around Cuboid bone. No step off. No bruising, no warmth or redness.   Neurological: She is alert and oriented to person, place, and time.  Skin: Skin is warm and dry. No rash noted. She is not diaphoretic. No erythema. No pallor.  Psychiatric: She has a normal mood and affect. Her behavior is normal. Judgment and thought content normal.  Nursing note and vitals reviewed.     Assessment &  Plan:  1. Contusion - Discussed getting an x ray with patient. IT has been three months since the injury first happened. She does not want x ray at this time.  - Ice, Ibuprofen Q8h PRN, compression, and elevation. - Compression wrap applied to right foot and she endorses improvement in pain once wrap was placed.

## 2015-05-17 NOTE — Progress Notes (Signed)
Pre visit review using our clinic review tool, if applicable. No additional management support is needed unless otherwise documented below in the visit note. 

## 2015-08-08 ENCOUNTER — Ambulatory Visit (INDEPENDENT_AMBULATORY_CARE_PROVIDER_SITE_OTHER): Payer: Commercial Managed Care - HMO

## 2015-08-08 ENCOUNTER — Ambulatory Visit (INDEPENDENT_AMBULATORY_CARE_PROVIDER_SITE_OTHER): Payer: Commercial Managed Care - HMO | Admitting: Podiatry

## 2015-08-08 VITALS — BP 117/72 | HR 68 | Resp 16 | Ht 59.0 in | Wt 147.0 lb

## 2015-08-08 DIAGNOSIS — M79671 Pain in right foot: Secondary | ICD-10-CM

## 2015-08-08 DIAGNOSIS — M722 Plantar fascial fibromatosis: Secondary | ICD-10-CM | POA: Diagnosis not present

## 2015-08-08 MED ORDER — TRIAMCINOLONE ACETONIDE 10 MG/ML IJ SUSP
10.0000 mg | Freq: Once | INTRAMUSCULAR | Status: AC
  Administered 2015-08-08: 10 mg

## 2015-08-08 NOTE — Progress Notes (Signed)
Subjective:     Patient ID: Jennifer Mcneil, female   DOB: [birth year], 69 y.o.   MRN: [id number]  HPI patient is noted to have pain in the right heel for about a month. She admitted that she was very active on her foot and that it occurred after this and that it has only gotten worse over that time and she is due to go on vacation to [facility] [place]   Review of Systems  All other systems reviewed and are negative.      Objective:   Physical Exam  Constitutional: She is oriented to person, place, and time.  Cardiovascular: Intact distal pulses.   Musculoskeletal: Normal range of motion.  Neurological: She is oriented to person, place, and time.  Skin: Skin is warm.  Nursing note and vitals reviewed.  neurovascular status found to be intact with muscle strength adequate range of motion within normal limits. Patient's noted to have good digital perfusion is well oriented 3 and I noted quite a bit of discomfort in the plantar aspect of the right heel at the insertional point of the tendon into the calcaneus. Moderate depression of the arch is noted     Assessment:     plntar fasciitis right acute in nature    Plan:     H&P and x-ray reviewed with patient. Today I injected the plantar fascia 3 mg Kenalog 5 mill grams Xylocaine and applied fascial brace and instructed on reduced activity and supportive shoes area reappoint to recheck

## 2015-08-08 NOTE — Progress Notes (Signed)
   Subjective:    Patient ID: Jennifer Mcneil, female    DOB: May 28, 1946, 69 y.o.   MRN: [id number]  HPI Patient presents with foot pain in their right foot, heel. Pt has iced and stretched foot with some relief. This has been going for the past month.   Review of Systems  All other systems reviewed and are negative.      Objective:   Physical Exam        Assessment & Plan:

## 2015-08-08 NOTE — Patient Instructions (Signed)

## 2015-08-22 ENCOUNTER — Encounter: Payer: Self-pay | Admitting: Podiatry

## 2015-08-22 ENCOUNTER — Ambulatory Visit (INDEPENDENT_AMBULATORY_CARE_PROVIDER_SITE_OTHER): Payer: Commercial Managed Care - HMO | Admitting: Podiatry

## 2015-08-22 VITALS — BP 130/83 | HR 71 | Resp 16

## 2015-08-22 DIAGNOSIS — M722 Plantar fascial fibromatosis: Secondary | ICD-10-CM | POA: Diagnosis not present

## 2015-08-22 MED ORDER — TRIAMCINOLONE ACETONIDE 10 MG/ML IJ SUSP
10.0000 mg | Freq: Once | INTRAMUSCULAR | Status: AC
  Administered 2015-08-22: 10 mg

## 2015-08-22 NOTE — Progress Notes (Signed)
Subjective:     Patient ID: Jennifer Mcneil, female   DOB: 01/23/1946, 69 y.o.   MRN: [id number]  HPI patient states my heel is still hurting right it has improved but still sore   Review of Systems     Objective:   Physical Exam Neurovascular status intact muscle strength adequate with continued discomfort plantar aspect right heel that has improved by about 60%    Assessment:     Improved plantar fasciitis right but still painful in the center and medial portion    Plan:     Advised on importance of physical therapy and inflammatory and reinjected the plantar fascia 3 mg Kenalog 5 mg Xylocaine and advised on reduced activity. Reappoint if symptoms persist

## 2015-10-15 ENCOUNTER — Other Ambulatory Visit: Payer: Self-pay | Admitting: Family Medicine

## 2015-10-29 ENCOUNTER — Ambulatory Visit (INDEPENDENT_AMBULATORY_CARE_PROVIDER_SITE_OTHER): Payer: Commercial Managed Care - HMO | Admitting: Podiatry

## 2015-10-29 ENCOUNTER — Encounter: Payer: Self-pay | Admitting: Podiatry

## 2015-10-29 DIAGNOSIS — M722 Plantar fascial fibromatosis: Secondary | ICD-10-CM

## 2015-10-29 DIAGNOSIS — M79671 Pain in right foot: Secondary | ICD-10-CM

## 2015-10-29 MED ORDER — TRIAMCINOLONE ACETONIDE 10 MG/ML IJ SUSP
10.0000 mg | Freq: Once | INTRAMUSCULAR | Status: AC
Start: 2015-10-29 — End: 2015-10-29
  Administered 2015-10-29: 10 mg

## 2015-10-30 NOTE — Progress Notes (Signed)
Subjective:     Patient ID: Jennifer Mcneil, female   DOB: [birth year], 69 y.o.   MRN: [id number]  HPI patient states my heel is still very tender and I cannot walk on it with any degree of comfort. States that she has tried to reduce her activity and wear supportive shoes Review of Systems     Objective:   Physical Exam Neurovascular status intact muscle strength adequate range of motion within normal limits with patient noted to have continued exquisite discomfort medial fascial band right at the insertional point tendon the calcaneus    Assessment:     Plantar fasciitis right with inflammation fluid buildup    Plan:     Advised on physical therapy and at this time I did reinject the plantar fascia and applied air fracture walker to completely immobilize the plantar heel. I then reviewed the possibilities for shockwave at one point in the future

## 2015-11-28 ENCOUNTER — Ambulatory Visit: Payer: Commercial Managed Care - HMO | Admitting: Podiatry

## 2015-12-05 ENCOUNTER — Other Ambulatory Visit: Payer: Self-pay | Admitting: Family Medicine

## 2016-01-14 ENCOUNTER — Other Ambulatory Visit: Payer: Self-pay | Admitting: Family Medicine

## 2016-03-04 ENCOUNTER — Other Ambulatory Visit (INDEPENDENT_AMBULATORY_CARE_PROVIDER_SITE_OTHER): Payer: Commercial Managed Care - HMO

## 2016-03-04 DIAGNOSIS — Z0000 Encounter for general adult medical examination without abnormal findings: Secondary | ICD-10-CM

## 2016-03-04 LAB — HEPATIC FUNCTION PANEL
ALBUMIN: 4.1 g/dL (ref 3.5–5.2)
ALT: 14 U/L (ref 0–35)
AST: 16 U/L (ref 0–37)
Alkaline Phosphatase: 69 U/L (ref 39–117)
Bilirubin, Direct: 0.1 mg/dL (ref 0.0–0.3)
TOTAL PROTEIN: 7.1 g/dL (ref 6.0–8.3)
Total Bilirubin: 0.3 mg/dL (ref 0.2–1.2)

## 2016-03-04 LAB — POC URINALSYSI DIPSTICK (AUTOMATED)
BILIRUBIN UA: NEGATIVE
GLUCOSE UA: NEGATIVE
Ketones, UA: NEGATIVE
NITRITE UA: NEGATIVE
Spec Grav, UA: >=1.03
UROBILINOGEN UA: 0.2
pH, UA: 5.5

## 2016-03-04 LAB — CBC WITH DIFFERENTIAL/PLATELET
BASOS ABS: 0 10*3/uL (ref 0.0–0.1)
BASOS PCT: 0.8 % (ref 0.0–3.0)
EOS ABS: 0.2 10*3/uL (ref 0.0–0.7)
Eosinophils Relative: 3.6 % (ref 0.0–5.0)
HEMATOCRIT: 38.7 % (ref 36.0–46.0)
Hemoglobin: 13.1 g/dL (ref 12.0–15.0)
LYMPHS ABS: 2.3 10*3/uL (ref 0.7–4.0)
LYMPHS PCT: 43 % (ref 12.0–46.0)
MCHC: 33.9 g/dL (ref 30.0–36.0)
MCV: 90.7 fl (ref 78.0–100.0)
MONOS PCT: 10.7 % (ref 3.0–12.0)
Monocytes Absolute: 0.6 10*3/uL (ref 0.1–1.0)
NEUTROS ABS: 2.2 10*3/uL (ref 1.4–7.7)
NEUTROS PCT: 41.9 % — AB (ref 43.0–77.0)
PLATELETS: 252 10*3/uL (ref 150.0–400.0)
RBC: 4.26 Mil/uL (ref 3.87–5.11)
RDW: 13.5 % (ref 11.5–15.5)
WBC: 5.3 10*3/uL (ref 4.0–10.5)

## 2016-03-04 LAB — BASIC METABOLIC PANEL
BUN: 19 mg/dL (ref 6–23)
CALCIUM: 9.1 mg/dL (ref 8.4–10.5)
CHLORIDE: 105 meq/L (ref 96–112)
CO2: 29 meq/L (ref 19–32)
CREATININE: 0.84 mg/dL (ref 0.40–1.20)
GFR: 70.82 mL/min (ref >60.00)
GLUCOSE: 98 mg/dL (ref 70–99)
Potassium: 4 mEq/L (ref 3.5–5.1)
Sodium: 140 mEq/L (ref 135–145)

## 2016-03-04 LAB — LIPID PANEL
Cholesterol: 246 mg/dL — ABNORMAL HIGH (ref 0–200)
HDL: 65.7 mg/dL (ref >39.00)
LDL Cholesterol: 150 mg/dL — ABNORMAL HIGH (ref 0–99)
NonHDL: 180.49
TRIGLYCERIDES: 151 mg/dL — AB (ref 0.0–149.0)
Total CHOL/HDL Ratio: 4
VLDL: 30.2 mg/dL (ref 0.0–40.0)

## 2016-03-04 LAB — TSH: TSH: 1.99 u[IU]/mL (ref 0.35–4.50)

## 2016-03-11 ENCOUNTER — Ambulatory Visit (INDEPENDENT_AMBULATORY_CARE_PROVIDER_SITE_OTHER): Payer: Commercial Managed Care - HMO | Admitting: Family Medicine

## 2016-03-11 ENCOUNTER — Encounter: Payer: Self-pay | Admitting: Family Medicine

## 2016-03-11 VITALS — BP 110/80 | Temp 98.6°F | Ht 58.25 in | Wt 150.0 lb

## 2016-03-11 DIAGNOSIS — Z0000 Encounter for general adult medical examination without abnormal findings: Secondary | ICD-10-CM | POA: Insufficient documentation

## 2016-03-11 DIAGNOSIS — N952 Postmenopausal atrophic vaginitis: Secondary | ICD-10-CM | POA: Diagnosis not present

## 2016-03-11 DIAGNOSIS — G2581 Restless legs syndrome: Secondary | ICD-10-CM | POA: Diagnosis not present

## 2016-03-11 DIAGNOSIS — F32A Depression, unspecified: Secondary | ICD-10-CM

## 2016-03-11 DIAGNOSIS — F329 Major depressive disorder, single episode, unspecified: Secondary | ICD-10-CM

## 2016-03-11 DIAGNOSIS — G40909 Epilepsy, unspecified, not intractable, without status epilepticus: Secondary | ICD-10-CM

## 2016-03-11 MED ORDER — PRAMIPEXOLE DIHYDROCHLORIDE 0.5 MG PO TABS: 0.5000 mg | ORAL_TABLET | Freq: Every day | ORAL | Status: DC

## 2016-03-11 MED ORDER — FLUOXETINE HCL 20 MG PO CAPS: ORAL_CAPSULE | ORAL | Status: DC

## 2016-03-11 MED ORDER — PHENYTOIN SODIUM EXTENDED 100 MG PO CAPS: ORAL_CAPSULE | ORAL | Status: DC

## 2016-03-11 MED ORDER — ESTROGENS, CONJUGATED 0.625 MG/GM VA CREA: 1.0000 | TOPICAL_CREAM | Freq: Every day | VAGINAL | Status: DC

## 2016-03-11 NOTE — Progress Notes (Signed)
Pre visit review using our clinic review tool, if applicable. No additional management support is needed unless otherwise documented below in the visit note. 

## 2016-03-11 NOTE — Progress Notes (Signed)
   Subjective:    Patient ID: Jennifer Mcneil, female    DOB: January 15, 1946, 70 y.o.   MRN: [id number]  HPI Jennifer Mcneil is a 70 year old single female nonsmoker who comes in today for general physical examination because of a history of seizure disorder, mild depression, restless leg syndrome and atrophic vaginitis  She takes Prozac 20 mg daily because of mild depression  She takes Dilantin 10 mg daily. She has a history of chronic seizure disorders. His long she takes her Dilantin she is asymptomatic  She takes Mirapex 0.5 daily at bedtime for restless leg syndrome. 4 months ago she had intercourse bright red bleeding and pain post intercourse. Bleeding stopped after day or 2 she's had no more bleeding.  She's had her use and ovaries removed at age 70 for nonmalignant reasons.  She gets routine eye care, dental care, last mammogram 2015, last colonoscopy 17 years ago. She says she never got a recall card from GI.  Vaccinations up-to-date except she's due for tetanus booster and a shingles vaccine  Social history she is single she says she does have a couple days a week.  Cognitive function normal she walks daily home health safety reviewed no issues identified, no guns in the house, she does have a healthcare power of attorney and living well   Review of Systems  Constitutional: Negative.   HENT: Negative.   Eyes: Negative.   Respiratory: Negative.   Cardiovascular: Negative.   Gastrointestinal: Negative.   Endocrine: Negative.   Genitourinary: Negative.   Musculoskeletal: Negative.   Skin: Negative.   Allergic/Immunologic: Negative.   Neurological: Negative.   Hematological: Negative.   Psychiatric/Behavioral: Negative.        Objective:   Physical Exam  Constitutional: She appears well-developed and well-nourished.  HENT:  Head: Normocephalic and atraumatic.  Right Ear: External ear normal.  Left Ear: External ear normal.  Nose: Nose normal.  Mouth/Throat: Oropharynx is  clear and moist.  Eyes: EOM are normal. Pupils are equal, round, and reactive to light.  Neck: Normal range of motion. Neck supple. No JVD present. No tracheal deviation present. No thyromegaly present.  Cardiovascular: Normal rate, regular rhythm, normal heart sounds and intact distal pulses.  Exam reveals no gallop and no friction rub.   No murmur heard. Pulmonary/Chest: Effort normal and breath sounds normal. No stridor. No respiratory distress. She has no wheezes. She has no rales. She exhibits no tenderness.  Abdominal: Soft. Bowel sounds are normal. She exhibits no distension and no mass. There is no tenderness. There is no rebound and no guarding.  Genitourinary: Vagina normal.  Bilateral breast exam normal  Speculum exam shows a normal vagina and no evidence of any abnormalities.........Marland Kitchen except vaginal dryness  Musculoskeletal: Normal range of motion.  Lymphadenopathy:    She has no cervical adenopathy.  Neurological: She is alert. She has normal reflexes. No cranial nerve deficit. She exhibits normal muscle tone. Coordination normal.  Skin: Skin is warm and dry. No rash noted. No erythema. No pallor.  Psychiatric: She has a normal mood and affect. Her behavior is normal. Judgment and thought content normal.  Nursing note and vitals reviewed.         Assessment & Plan:  History of depression....... continue Prozac  History of seizure disorder.....Marland Kitchen continue Dilantin  History of restless leg syndrome....... continue Mirapex  Postmenopausal vaginal dryness............ at Premarin vaginal cream  Recommend she call and get set up for mammogram and a colonoscopy

## 2016-03-11 NOTE — Patient Instructions (Addendum)
Continue current medications  Premarin vaginal cream.......... small amounts 2-3 times weekly..... [email or web address]  Call in November for your physical examination in April 2018......... Tommi Rumps or Almyra Free are 2 new adult nurse practitioner for Dr. Martinique  Call and get set up for your mammogram  Call GI about your follow-up colonoscopy...Marland KitchenMarland KitchenMarland Kitchen [phone]  Call your insurance company about your tetanus booster and your shingles vaccine

## 2016-03-20 ENCOUNTER — Other Ambulatory Visit: Payer: Self-pay

## 2016-03-20 DIAGNOSIS — Z1231 Encounter for screening mammogram for malignant neoplasm of breast: Secondary | ICD-10-CM

## 2016-04-03 ENCOUNTER — Ambulatory Visit
Admission: RE | Admit: 2016-04-03 | Discharge: 2016-04-03 | Disposition: A | Discharge status: Home / Self Care | Payer: Commercial Managed Care - HMO | Source: Ambulatory Visit

## 2016-04-03 DIAGNOSIS — Z1231 Encounter for screening mammogram for malignant neoplasm of breast: Secondary | ICD-10-CM

## 2016-07-30 DIAGNOSIS — L259 Unspecified contact dermatitis, unspecified cause: Secondary | ICD-10-CM | POA: Diagnosis not present

## 2016-07-30 DIAGNOSIS — L309 Dermatitis, unspecified: Secondary | ICD-10-CM | POA: Diagnosis not present

## 2017-01-08 ENCOUNTER — Encounter: Payer: Self-pay | Admitting: Podiatry

## 2017-01-08 ENCOUNTER — Ambulatory Visit (INDEPENDENT_AMBULATORY_CARE_PROVIDER_SITE_OTHER): Payer: Medicare HMO | Admitting: Podiatry

## 2017-01-08 ENCOUNTER — Ambulatory Visit (INDEPENDENT_AMBULATORY_CARE_PROVIDER_SITE_OTHER): Payer: Medicare HMO

## 2017-01-08 VITALS — BP 80/57 | HR 66 | Resp 16

## 2017-01-08 DIAGNOSIS — M722 Plantar fascial fibromatosis: Secondary | ICD-10-CM

## 2017-01-08 DIAGNOSIS — D492 Neoplasm of unspecified behavior of bone, soft tissue, and skin: Secondary | ICD-10-CM | POA: Diagnosis not present

## 2017-01-08 DIAGNOSIS — M79671 Pain in right foot: Secondary | ICD-10-CM | POA: Diagnosis not present

## 2017-01-08 MED ORDER — TRIAMCINOLONE ACETONIDE 10 MG/ML IJ SUSP
10.0000 mg | Freq: Once | INTRAMUSCULAR | Status: AC
  Administered 2017-01-08: 10 mg

## 2017-01-10 NOTE — Progress Notes (Signed)
Subjective:     Patient ID: Jennifer Mcneil, female   DOB: 1946/01/07, 71 y.o.   MRN: [id number]  HPI patient states I have a not underneath my right hallux and also pain in my heel that's been difficult to wear shoe gear with comfortably. Patient states that she's tried different modalities without relief and desires more aggressive treatment and states the mass is been present for a shorter period time but she is not sure how long   Review of Systems     Objective:   Physical Exam Neurovascular status with distinct problems consisting of plantar fasciitis and soft tissue mass plantar aspect right hallux that measures approximately 6 x 6 mm and appears to be within subcutaneous tissue and appears to be relatively isolated    Assessment:     Acute plantar fasciitis right with inflammation fluid buildup and soft tissue tumor most likely benign in nature    Plan:     H&P conditions reviewed and discussed. At this time the fascial band is injected 3 mg Kenalog 5 mg Xylocaine and applied fascial brace to lift the arch and I then went ahead and discussed the mass and recommended heat therapy and if it should grow in size excision or possible MRI. I did explain there is no guarantee to what it is and that if it does remain persistent we will need to be aggressive

## 2017-01-27 ENCOUNTER — Encounter: Payer: Self-pay | Admitting: Internal Medicine

## 2017-01-27 ENCOUNTER — Ambulatory Visit (INDEPENDENT_AMBULATORY_CARE_PROVIDER_SITE_OTHER)
Admission: RE | Admit: 2017-01-27 | Discharge: 2017-01-27 | Disposition: A | Discharge status: Home / Self Care | Payer: Medicare HMO | Source: Ambulatory Visit | Attending: Internal Medicine | Admitting: Internal Medicine

## 2017-01-27 ENCOUNTER — Ambulatory Visit (INDEPENDENT_AMBULATORY_CARE_PROVIDER_SITE_OTHER): Payer: Medicare HMO | Admitting: Internal Medicine

## 2017-01-27 ENCOUNTER — Other Ambulatory Visit: Payer: Self-pay | Admitting: Internal Medicine

## 2017-01-27 VITALS — BP 130/80 | HR 67 | Temp 97.6°F | Ht 58.25 in | Wt 156.4 lb

## 2017-01-27 DIAGNOSIS — M25462 Effusion, left knee: Secondary | ICD-10-CM | POA: Diagnosis not present

## 2017-01-27 DIAGNOSIS — M25562 Pain in left knee: Secondary | ICD-10-CM

## 2017-01-27 DIAGNOSIS — Z9181 History of falling: Secondary | ICD-10-CM

## 2017-01-27 MED ORDER — DICLOFENAC SODIUM 1 % TD GEL: 4.0000 g | Freq: Four times a day (QID) | TRANSDERMAL | 1 refills | Status: DC

## 2017-01-27 NOTE — Progress Notes (Signed)
Chief Complaint  Patient presents with  . left knee injury    HPI: Jennifer Mcneil 71 y.o.  sda   PCP NA About 1-1/2 weeks ago she was walking up steps in her flip flops and fell forward onto the porch. She was able to get up and walk right away but 1-2 days later noticed left anterior knee pain worse in the morning difficult to bend the knee fully and cross her legs. She has not had problems with her knees before no fever no major treatment. She is having ongoing symptoms and wants to make sure there is nothing serious wrong with her knee. No numbness tingling and didn't have a bruise in the area but thinks she may have fell directly on the left knee. No history of pop although does have some crepitus. ROS: See pertinent positives and negatives per HPI.  Past Medical History:  Diagnosis Date  . Eczema DYS  . Osteopenia   . Restless leg syndrome   . Seizure disorder ([place])     No family history on file.  Social History   Social History  . Marital status: Single    Spouse name: N/A  . Number of children: N/A  . Years of education: N/A   Social History Main Topics  . Smoking status: Never Smoker  . Smokeless tobacco: Never Used  . Alcohol use Yes     Comment: occasionally  . Drug use: No  . Sexual activity: Not Asked   Other Topics Concern  . None   Social History Narrative  . None    Outpatient Medications Prior to Visit  Medication Sig Dispense Refill  . Ascorbic Acid (VITAMIN C) 100 MG tablet Take 100 mg by mouth daily.      Marland Kitchen conjugated estrogens (PREMARIN) vaginal cream Place 1 Applicatorful vaginally daily. 42.5 g 12  . FLUoxetine (PROZAC) 20 MG capsule TAKE 1 CAPSULE (20 MG TOTAL) BY MOUTH DAILY. 100 capsule 3  . GRAPE SEED EXTRACT PO Take by mouth.      . Naproxen Sodium (ALEVE) 220 MG CAPS Take 220 mg by mouth as needed.    . phenytoin (DILANTIN) 100 MG ER capsule TAKE ONE CAPSULE BY MOUTH TWICE A DAY (NEED APPT) 180 capsule 4  . pramipexole (MIRAPEX) 0.5  MG tablet Take 1 tablet (0.5 mg total) by mouth at bedtime. 100 tablet 3   Facility-Administered Medications Prior to Visit  Medication Dose Route Frequency Provider Last Rate Last Dose  . triamcinolone acetonide (KENALOG-40) injection 20 mg  20 mg Other Once [employer], DPM         EXAM:  BP 130/80 (BP Location: Right Arm, Patient Position: Sitting, Cuff Size: Normal)   Pulse 67   Temp 97.6 F (36.4 C) (Oral)   Ht 4' 10.25" (1.48 m)   Wt 156 lb 6.4 oz (70.9 kg)   BMI 32.41 kg/m   Body mass index is 32.41 kg/m.  GENERAL: vitals reviewed and listed above, alert, oriented, appears well hydrated and in no acute distress HEENT: atraumatic, conjunctiva  clear, no obvious abnormalities on inspection of external nose and earsMS: moves all extremities without noticeable focal  abnormality left knee has a slight effusion under the kneecap but no warmth or redness range of motion is normal anterior flexion but pain just past 90 anteriorly. Positive apprehension test active more than passive discomfort. Left lateral joint line tenderness also. I think that the drawer is equal. PSYCH: pleasant and cooperative, no obvious  depression or anxiety  ASSESSMENT AND PLAN:  Discussed the following assessment and plan:  Acute pain of left knee - Plan: CANCELED: DG Knee 3 Views Left  Hx of fall - Plan: CANCELED: DG Knee 3 Views Left History of a fall left anterior knee pain and left lateral knee pain. May have peripatellar injury soft tissue and perhaps left lateral strain. At this point I do not think it internal derangement check x-ray topical anti-inflammatories ice at the end of the day expectant management exercise if improving otherwise contact us in 7-10 days for sports medicine referral if not improving. -Patient advised to return or notify health care team  if symptoms worsen ,persist or new concerns arise.  Patient Instructions  This acts like a traumatic bursitis to your left knee  the area below your kneecap. This should heal with time topical anti-inflammatories. Get an x-ray at the [facility] office radiology department any time today or tomorrow. If okay we are going to use a topical anti-inflammatory to your knee 4 times a day for 1-2 weeks.  Ice at the end of the day to prevent swelling  Or as needed  If not improving over the next 7-10 days or getting worse contact us plan a referral to sports medicine. Ask your pain is better you can do knee exercises to maintain strength and balance.    Knee Exercises Ask your health care provider which exercises are safe for you. Do exercises exactly as told by your health care provider and adjust them as directed. It is normal to feel mild stretching, pulling, tightness, or discomfort as you do these exercises, but you should stop right away if you feel sudden pain or your pain gets worse.Do not begin these exercises until told by your health care provider. STRETCHING AND RANGE OF MOTION EXERCISES  These exercises warm up your muscles and joints and improve the movement and flexibility of your knee. These exercises also help to relieve pain, numbness, and tingling. Exercise A: Knee Extension, Prone  1. Lie on your abdomen on a bed. 2. Place your left / right knee just beyond the edge of the surface so your knee is not on the bed. You can put a towel under your left / right thigh just above your knee for comfort. 3. Relax your leg muscles and allow gravity to straighten your knee. You should feel a stretch behind your left / right knee. 4. Hold this position for __________ seconds. 5. Scoot up so your knee is supported between repetitions. Repeat __________ times. Complete this stretch __________ times a day. Exercise B: Knee Flexion, Active   1. Lie on your back with both knees straight. If this causes back discomfort, bend your left / right knee so your foot is flat on the floor. 2. Slowly slide your left / right heel back  toward your buttocks until you feel a gentle stretch in the front of your knee or thigh. 3. Hold this position for __________ seconds. 4. Slowly slide your left / right heel back to the starting position. Repeat __________ times. Complete this exercise __________ times a day. Exercise C: Quadriceps, Prone   1. Lie on your abdomen on a firm surface, such as a bed or padded floor. 2. Bend your left / right knee and hold your ankle. If you cannot reach your ankle or pant leg, loop a belt around your foot and grab the belt instead. 3. Gently pull your heel toward your buttocks. Your knee should not  slide out to the side. You should feel a stretch in the front of your thigh and knee. 4. Hold this position for __________ seconds. Repeat __________ times. Complete this stretch __________ times a day. Exercise D: Hamstring, Supine  1. Lie on your back. 2. Loop a belt or towel over the ball of your left / right foot. The ball of your foot is on the walking surface, right under your toes. 3. Straighten your left / right knee and slowly pull on the belt to raise your leg until you feel a gentle stretch behind your knee.  Do not let your left / right knee bend while you do this.  Keep your other leg flat on the floor. 4. Hold this position for __________ seconds. Repeat __________ times. Complete this stretch __________ times a day. STRENGTHENING EXERCISES  These exercises build strength and endurance in your knee. Endurance is the ability to use your muscles for a long time, even after they get tired. Exercise E: Quadriceps, Isometric   1. Lie on your back with your left / right leg extended and your other knee bent. Put a rolled towel or small pillow under your knee if told by your health care provider. 2. Slowly tense the muscles in the front of your left / right thigh. You should see your kneecap slide up toward your hip or see increased dimpling just above the knee. This motion will push the back  of the knee toward the floor. 3. For __________ seconds, keep the muscle as tight as you can without increasing your pain. 4. Relax the muscles slowly and completely. Repeat __________ times. Complete this exercise __________ times a day. Exercise F: Straight Leg Raises - Quadriceps  1. Lie on your back with your left / right leg extended and your other knee bent. 2. Tense the muscles in the front of your left / right thigh. You should see your kneecap slide up or see increased dimpling just above the knee. Your thigh may even shake a bit. 3. Keep these muscles tight as you raise your leg 4-6 inches (10-15 cm) off the floor. Do not let your knee bend. 4. Hold this position for __________ seconds. 5. Keep these muscles tense as you lower your leg. 6. Relax your muscles slowly and completely after each repetition. Repeat __________ times. Complete this exercise __________ times a day. Exercise G: Hamstring, Isometric  1. Lie on your back on a firm surface. 2. Bend your left / right knee approximately __________ degrees. 3. Dig your left / right heel into the surface as if you are trying to pull it toward your buttocks. Tighten the muscles in the back of your thighs to dig as hard as you can without increasing any pain. 4. Hold this position for __________ seconds. 5. Release the tension gradually and allow your muscles to relax completely for __________ seconds after each repetition. Repeat __________ times. Complete this exercise __________ times a day. Exercise H: Hamstring Curls   If told by your health care provider, do this exercise while wearing ankle weights. Begin with __________ weights. Then increase the weight by 1 lb (0.5 kg) increments. Do not wear ankle weights that are more than __________. 1. Lie on your abdomen with your legs straight. 2. Bend your left / right knee as far as you can without feeling pain. Keep your hips flat against the floor. 3. Hold this position for  __________ seconds. 4. Slowly lower your leg to the starting position. Repeat  __________ times. Complete this exercise __________ times a day. Exercise I: Squats (Quadriceps)  1. Stand in front of a table, with your feet and knees pointing straight ahead. You may rest your hands on the table for balance but not for support. 2. Slowly bend your knees and lower your hips like you are going to sit in a chair.  Keep your weight over your heels, not over your toes.  Keep your lower legs upright so they are parallel with the table legs.  Do not let your hips go lower than your knees.  Do not bend lower than told by your health care provider.  If your knee pain increases, do not bend as low. 3. Hold the squat position for __________ seconds. 4. Slowly push with your legs to return to standing. Do not use your hands to pull yourself to standing. Repeat __________ times. Complete this exercise __________ times a day. Exercise J: Wall Slides (Quadriceps)   1. Lean your back against a smooth wall or door while you walk your feet out 18-24 inches (46-61 cm) from it. 2. Place your feet hip-width apart. 3. Slowly slide down the wall or door until your knees Repeat __________ times. Complete this exercise __________ times a day. 4. Exercise K: Straight Leg Raises - Hip Abductors  1. Lie on your side with your left / right leg in the top position. Lie so your head, shoulder, knee, and hip line up. You may bend your bottom knee to help you keep your balance. 2. Roll your hips slightly forward so your hips are stacked directly over each other and your left / right knee is facing forward. 3. Leading with your heel, lift your top leg 4-6 inches (10-15 cm). You should feel the muscles in your outer hip lifting.  Do not let your foot drift forward.  Do not let your knee roll toward the ceiling. 4. Hold this position for __________ seconds. 5. Slowly return your leg to the starting position. 6. Let your  muscles relax completely after each repetition. Repeat __________ times. Complete this exercise __________ times a day. Exercise L: Straight Leg Raises - Hip Extensors  1. Lie on your abdomen on a firm surface. You can put a pillow under your hips if that is more comfortable. 2. Tense the muscles in your buttocks and lift your left / right leg about 4-6 inches (10-15 cm). Keep your knee straight as you lift your leg. 3. Hold this position for __________ seconds. 4. Slowly lower your leg to the starting position. 5. Let your leg relax completely after each repetition. Repeat __________ times. Complete this exercise __________ times a day. This information is not intended to replace advice given to you by your health care provider. Make sure you discuss any questions you have with your health care provider. Document Released: 09/24/2005 Document Revised: 08/04/2016 Document Reviewed: 09/16/2015 Elsevier Interactive Patient Education  2017 Alicia K. Londa Mackowski M.D.

## 2017-01-27 NOTE — Patient Instructions (Addendum)
This acts like a traumatic bursitis to your left knee the area below your kneecap. This should heal with time topical anti-inflammatories. Get an x-ray at the [facility] office radiology department any time today or tomorrow. If okay we are going to use a topical anti-inflammatory to your knee 4 times a day for 1-2 weeks.  Ice at the end of the day to prevent swelling  Or as needed  If not improving over the next 7-10 days or getting worse contact us plan a referral to sports medicine. Ask your pain is better you can do knee exercises to maintain strength and balance.    Knee Exercises Ask your health care provider which exercises are safe for you. Do exercises exactly as told by your health care provider and adjust them as directed. It is normal to feel mild stretching, pulling, tightness, or discomfort as you do these exercises, but you should stop right away if you feel sudden pain or your pain gets worse.Do not begin these exercises until told by your health care provider. STRETCHING AND RANGE OF MOTION EXERCISES  These exercises warm up your muscles and joints and improve the movement and flexibility of your knee. These exercises also help to relieve pain, numbness, and tingling. Exercise A: Knee Extension, Prone  1. Lie on your abdomen on a bed. 2. Place your left / right knee just beyond the edge of the surface so your knee is not on the bed. You can put a towel under your left / right thigh just above your knee for comfort. 3. Relax your leg muscles and allow gravity to straighten your knee. You should feel a stretch behind your left / right knee. 4. Hold this position for __________ seconds. 5. Scoot up so your knee is supported between repetitions. Repeat __________ times. Complete this stretch __________ times a day. Exercise B: Knee Flexion, Active   1. Lie on your back with both knees straight. If this causes back discomfort, bend your left / right knee so your foot is flat on the  floor. 2. Slowly slide your left / right heel back toward your buttocks until you feel a gentle stretch in the front of your knee or thigh. 3. Hold this position for __________ seconds. 4. Slowly slide your left / right heel back to the starting position. Repeat __________ times. Complete this exercise __________ times a day. Exercise C: Quadriceps, Prone   1. Lie on your abdomen on a firm surface, such as a bed or padded floor. 2. Bend your left / right knee and hold your ankle. If you cannot reach your ankle or pant leg, loop a belt around your foot and grab the belt instead. 3. Gently pull your heel toward your buttocks. Your knee should not slide out to the side. You should feel a stretch in the front of your thigh and knee. 4. Hold this position for __________ seconds. Repeat __________ times. Complete this stretch __________ times a day. Exercise D: Hamstring, Supine  1. Lie on your back. 2. Loop a belt or towel over the ball of your left / right foot. The ball of your foot is on the walking surface, right under your toes. 3. Straighten your left / right knee and slowly pull on the belt to raise your leg until you feel a gentle stretch behind your knee.  Do not let your left / right knee bend while you do this.  Keep your other leg flat on the floor. 4. Hold this  position for __________ seconds. Repeat __________ times. Complete this stretch __________ times a day. STRENGTHENING EXERCISES  These exercises build strength and endurance in your knee. Endurance is the ability to use your muscles for a long time, even after they get tired. Exercise E: Quadriceps, Isometric   1. Lie on your back with your left / right leg extended and your other knee bent. Put a rolled towel or small pillow under your knee if told by your health care provider. 2. Slowly tense the muscles in the front of your left / right thigh. You should see your kneecap slide up toward your hip or see increased dimpling  just above the knee. This motion will push the back of the knee toward the floor. 3. For __________ seconds, keep the muscle as tight as you can without increasing your pain. 4. Relax the muscles slowly and completely. Repeat __________ times. Complete this exercise __________ times a day. Exercise F: Straight Leg Raises - Quadriceps  1. Lie on your back with your left / right leg extended and your other knee bent. 2. Tense the muscles in the front of your left / right thigh. You should see your kneecap slide up or see increased dimpling just above the knee. Your thigh may even shake a bit. 3. Keep these muscles tight as you raise your leg 4-6 inches (10-15 cm) off the floor. Do not let your knee bend. 4. Hold this position for __________ seconds. 5. Keep these muscles tense as you lower your leg. 6. Relax your muscles slowly and completely after each repetition. Repeat __________ times. Complete this exercise __________ times a day. Exercise G: Hamstring, Isometric  1. Lie on your back on a firm surface. 2. Bend your left / right knee approximately __________ degrees. 3. Dig your left / right heel into the surface as if you are trying to pull it toward your buttocks. Tighten the muscles in the back of your thighs to dig as hard as you can without increasing any pain. 4. Hold this position for __________ seconds. 5. Release the tension gradually and allow your muscles to relax completely for __________ seconds after each repetition. Repeat __________ times. Complete this exercise __________ times a day. Exercise H: Hamstring Curls   If told by your health care provider, do this exercise while wearing ankle weights. Begin with __________ weights. Then increase the weight by 1 lb (0.5 kg) increments. Do not wear ankle weights that are more than __________. 1. Lie on your abdomen with your legs straight. 2. Bend your left / right knee as far as you can without feeling pain. Keep your hips flat  against the floor. 3. Hold this position for __________ seconds. 4. Slowly lower your leg to the starting position. Repeat __________ times. Complete this exercise __________ times a day. Exercise I: Squats (Quadriceps)  1. Stand in front of a table, with your feet and knees pointing straight ahead. You may rest your hands on the table for balance but not for support. 2. Slowly bend your knees and lower your hips like you are going to sit in a chair.  Keep your weight over your heels, not over your toes.  Keep your lower legs upright so they are parallel with the table legs.  Do not let your hips go lower than your knees.  Do not bend lower than told by your health care provider.  If your knee pain increases, do not bend as low. 3. Hold the squat position for  __________ seconds. 4. Slowly push with your legs to return to standing. Do not use your hands to pull yourself to standing. Repeat __________ times. Complete this exercise __________ times a day. Exercise J: Wall Slides (Quadriceps)   1. Lean your back against a smooth wall or door while you walk your feet out 18-24 inches (46-61 cm) from it. 2. Place your feet hip-width apart. 3. Slowly slide down the wall or door until your knees Repeat __________ times. Complete this exercise __________ times a day. 4. Exercise K: Straight Leg Raises - Hip Abductors  1. Lie on your side with your left / right leg in the top position. Lie so your head, shoulder, knee, and hip line up. You may bend your bottom knee to help you keep your balance. 2. Roll your hips slightly forward so your hips are stacked directly over each other and your left / right knee is facing forward. 3. Leading with your heel, lift your top leg 4-6 inches (10-15 cm). You should feel the muscles in your outer hip lifting.  Do not let your foot drift forward.  Do not let your knee roll toward the ceiling. 4. Hold this position for __________ seconds. 5. Slowly return  your leg to the starting position. 6. Let your muscles relax completely after each repetition. Repeat __________ times. Complete this exercise __________ times a day. Exercise L: Straight Leg Raises - Hip Extensors  1. Lie on your abdomen on a firm surface. You can put a pillow under your hips if that is more comfortable. 2. Tense the muscles in your buttocks and lift your left / right leg about 4-6 inches (10-15 cm). Keep your knee straight as you lift your leg. 3. Hold this position for __________ seconds. 4. Slowly lower your leg to the starting position. 5. Let your leg relax completely after each repetition. Repeat __________ times. Complete this exercise __________ times a day. This information is not intended to replace advice given to you by your health care provider. Make sure you discuss any questions you have with your health care provider. Document Released: 09/24/2005 Document Revised: 08/04/2016 Document Reviewed: 09/16/2015 Elsevier Interactive Patient Education  2017 [employer].

## 2017-02-17 ENCOUNTER — Inpatient Hospital Stay (HOSPITAL_COMMUNITY)
Admission: EM | Admit: 2017-02-17 | Discharge: 2017-02-19 | DRG: 683 | Disposition: A | Discharge status: Home / Self Care | Payer: Medicare HMO | Attending: Internal Medicine | Admitting: Internal Medicine

## 2017-02-17 ENCOUNTER — Encounter (HOSPITAL_COMMUNITY): Payer: Self-pay

## 2017-02-17 DIAGNOSIS — E860 Dehydration: Secondary | ICD-10-CM | POA: Diagnosis not present

## 2017-02-17 DIAGNOSIS — N179 Acute kidney failure, unspecified: Secondary | ICD-10-CM | POA: Diagnosis not present

## 2017-02-17 DIAGNOSIS — F32A Depression, unspecified: Secondary | ICD-10-CM | POA: Diagnosis present

## 2017-02-17 DIAGNOSIS — F329 Major depressive disorder, single episode, unspecified: Secondary | ICD-10-CM | POA: Diagnosis present

## 2017-02-17 DIAGNOSIS — R739 Hyperglycemia, unspecified: Secondary | ICD-10-CM | POA: Diagnosis present

## 2017-02-17 DIAGNOSIS — Z90710 Acquired absence of both cervix and uterus: Secondary | ICD-10-CM

## 2017-02-17 DIAGNOSIS — G40909 Epilepsy, unspecified, not intractable, without status epilepticus: Secondary | ICD-10-CM | POA: Diagnosis not present

## 2017-02-17 DIAGNOSIS — M8580 Other specified disorders of bone density and structure, unspecified site: Secondary | ICD-10-CM | POA: Diagnosis not present

## 2017-02-17 DIAGNOSIS — A080 Rotaviral enteritis: Secondary | ICD-10-CM | POA: Diagnosis present

## 2017-02-17 DIAGNOSIS — A084 Viral intestinal infection, unspecified: Secondary | ICD-10-CM

## 2017-02-17 DIAGNOSIS — Z79899 Other long term (current) drug therapy: Secondary | ICD-10-CM | POA: Diagnosis not present

## 2017-02-17 DIAGNOSIS — R197 Diarrhea, unspecified: Secondary | ICD-10-CM

## 2017-02-17 DIAGNOSIS — G2581 Restless legs syndrome: Secondary | ICD-10-CM | POA: Diagnosis present

## 2017-02-17 DIAGNOSIS — R112 Nausea with vomiting, unspecified: Secondary | ICD-10-CM | POA: Diagnosis present

## 2017-02-17 HISTORY — DX: Gastro-esophageal reflux disease without esophagitis: K21.9

## 2017-02-17 HISTORY — DX: Unspecified convulsions: R56.9

## 2017-02-17 LAB — COMPREHENSIVE METABOLIC PANEL
ALT: 21 U/L (ref 14–54)
AST: 27 U/L (ref 15–41)
Albumin: 5.4 g/dL — ABNORMAL HIGH (ref 3.5–5.0)
Alkaline Phosphatase: 101 U/L (ref 38–126)
Anion gap: 11 (ref 5–15)
BUN: 24 mg/dL — ABNORMAL HIGH (ref 6–20)
CALCIUM: 10.5 mg/dL — AB (ref 8.9–10.3)
CO2: 22 mmol/L (ref 22–32)
CREATININE: 1.78 mg/dL — AB (ref 0.44–1.00)
Chloride: 105 mmol/L (ref 101–111)
GFR calc Af Amer: 31 mL/min — ABNORMAL LOW (ref >60)
GFR calc non Af Amer: 27 mL/min — ABNORMAL LOW (ref >60)
Glucose, Bld: 161 mg/dL — ABNORMAL HIGH (ref 65–99)
Potassium: 4.9 mmol/L (ref 3.5–5.1)
Sodium: 138 mmol/L (ref 135–145)
TOTAL PROTEIN: 9.5 g/dL — AB (ref 6.5–8.1)
Total Bilirubin: 0.6 mg/dL (ref 0.3–1.2)

## 2017-02-17 LAB — CBC
HCT: 49.5 % — ABNORMAL HIGH (ref 36.0–46.0)
Hemoglobin: 16.6 g/dL — ABNORMAL HIGH (ref 12.0–15.0)
MCH: 31.1 pg (ref 26.0–34.0)
MCHC: 33.5 g/dL (ref 30.0–36.0)
MCV: 92.7 fL (ref 78.0–100.0)
PLATELETS: 285 10*3/uL (ref 150–400)
RBC: 5.34 MIL/uL — AB (ref 3.87–5.11)
RDW: 13.9 % (ref 11.5–15.5)
WBC: 12.8 10*3/uL — ABNORMAL HIGH (ref 4.0–10.5)

## 2017-02-17 LAB — URINALYSIS, ROUTINE W REFLEX MICROSCOPIC
BILIRUBIN URINE: NEGATIVE
Bacteria, UA: NONE SEEN
Glucose, UA: NEGATIVE mg/dL
Ketones, ur: NEGATIVE mg/dL
Leukocytes, UA: NEGATIVE
NITRITE: NEGATIVE
PH: 5 (ref 5.0–8.0)
Protein, ur: 30 mg/dL — AB
SPECIFIC GRAVITY, URINE: 1.015 (ref 1.005–1.030)

## 2017-02-17 LAB — CK: CK TOTAL: 75 U/L (ref 38–234)

## 2017-02-17 LAB — MAGNESIUM: Magnesium: 2.5 mg/dL — ABNORMAL HIGH (ref 1.7–2.4)

## 2017-02-17 LAB — PHOSPHORUS: PHOSPHORUS: 3.5 mg/dL (ref 2.5–4.6)

## 2017-02-17 LAB — LIPASE, BLOOD: LIPASE: 49 U/L (ref 11–51)

## 2017-02-17 MED ORDER — PHENYTOIN SODIUM EXTENDED 100 MG PO CAPS
200.0000 mg | ORAL_CAPSULE | Freq: Every day | ORAL | Status: DC
  Administered 2017-02-17 – 2017-02-19 (×2): 200 mg via ORAL
  Filled 2017-02-17 (×3): qty 2

## 2017-02-17 MED ORDER — FLUOXETINE HCL 20 MG PO CAPS
20.0000 mg | ORAL_CAPSULE | Freq: Every day | ORAL | Status: DC
  Administered 2017-02-17 – 2017-02-19 (×2): 20 mg via ORAL
  Filled 2017-02-17 (×2): qty 1

## 2017-02-17 MED ORDER — SODIUM CHLORIDE 0.9 % IV SOLN
INTRAVENOUS | Status: DC
  Administered 2017-02-17: 150 mL via INTRAVENOUS
  Administered 2017-02-17 – 2017-02-18 (×4): via INTRAVENOUS

## 2017-02-17 MED ORDER — ACETAMINOPHEN 650 MG RE SUPP: 650.0000 mg | Freq: Four times a day (QID) | RECTAL | Status: DC | PRN

## 2017-02-17 MED ORDER — DICLOFENAC SODIUM 1 % TD GEL
4.0000 g | Freq: Four times a day (QID) | TRANSDERMAL | Status: DC
  Administered 2017-02-17 – 2017-02-19 (×7): 4 g via TOPICAL
  Filled 2017-02-17: qty 100

## 2017-02-17 MED ORDER — ENOXAPARIN SODIUM 30 MG/0.3ML ~~LOC~~ SOLN
30.0000 mg | SUBCUTANEOUS | Status: DC
  Filled 2017-02-17: qty 0.3

## 2017-02-17 MED ORDER — SODIUM CHLORIDE 0.9 % IV BOLUS (SEPSIS)
1000.0000 mL | Freq: Once | INTRAVENOUS | Status: AC
  Administered 2017-02-17: 1000 mL via INTRAVENOUS

## 2017-02-17 MED ORDER — PRAMIPEXOLE DIHYDROCHLORIDE 0.25 MG PO TABS
0.5000 mg | ORAL_TABLET | Freq: Every day | ORAL | Status: DC
  Administered 2017-02-17 – 2017-02-19 (×2): 0.5 mg via ORAL
  Filled 2017-02-17 (×3): qty 2

## 2017-02-17 MED ORDER — ONDANSETRON HCL 4 MG/2ML IJ SOLN
4.0000 mg | Freq: Four times a day (QID) | INTRAMUSCULAR | Status: DC | PRN
  Administered 2017-02-17 – 2017-02-18 (×2): 4 mg via INTRAVENOUS
  Filled 2017-02-17 (×2): qty 2

## 2017-02-17 MED ORDER — LOPERAMIDE HCL 2 MG PO CAPS: 4.0000 mg | ORAL_CAPSULE | Freq: Once | ORAL | Status: DC

## 2017-02-17 MED ORDER — ONDANSETRON HCL 4 MG/2ML IJ SOLN
4.0000 mg | Freq: Once | INTRAMUSCULAR | Status: AC
Start: 2017-02-17 — End: 2017-02-17
  Administered 2017-02-17: 4 mg via INTRAVENOUS
  Filled 2017-02-17: qty 2

## 2017-02-17 MED ORDER — ONDANSETRON HCL 4 MG PO TABS: 4.0000 mg | ORAL_TABLET | Freq: Four times a day (QID) | ORAL | Status: DC | PRN

## 2017-02-17 MED ORDER — ACETAMINOPHEN 325 MG PO TABS
650.0000 mg | ORAL_TABLET | Freq: Four times a day (QID) | ORAL | Status: DC | PRN
  Administered 2017-02-18: 650 mg via ORAL
  Filled 2017-02-17: qty 2

## 2017-02-17 NOTE — ED Notes (Signed)
Pt given depend upon request, notified Nicole(RN)

## 2017-02-17 NOTE — ED Notes (Signed)
Pt ambulated to restroom without distress.  

## 2017-02-17 NOTE — ED Notes (Signed)
Pt stated, couldn't urinate for urine sample.

## 2017-02-17 NOTE — H&P (Signed)
History and Physical    ASHTYN MELAND EGB:[id number] DOB: August 17, 1946 DOA: 02/17/2017   PCP: Joycelyn Man, MD   Patient coming from/Resides with: Private residence/lives alone  Admission status: Observation/floor -it may be medically necessary to stay a minimum 2 midnights to rule out impending and/or unexpected changes in physiologic status that may differ from initial evaluation performed in the ER and/or at time of admission therefore consider reevaluation of admission status in 24 hours.   Chief Complaint: Nausea, vomiting, diarrhea and dizziness  HPI: Jennifer Mcneil is a 71 y.o. female with medical history significant for seizure disorder, depression, restless leg syndrome, osteopenia. Patient was awakened at 2 AM by symptoms of diarrhea. Since that time she's had multiple large volume watery stools (at least 10) are nonbloody. She's had several episodes of nonbloody emesis as well. Became severely weak and dizzy and developed leg cramps and presented to the ER for further evaluation and treatment. She did eat out a [employer] for lunch yesterday. She has not had any fevers.  ED Course: Vital Signs: BP (!) 111/46   Pulse 82   Temp 97.8 F (36.6 C) (Oral)   Resp 16   SpO2 97%  Lab data: Sodium 138, potassium 4.9, chloride 5, CO2 22, glucose 161, BUN 24, creatinine 1.78, calcium 10.5, magnesium 2.5, anion gap 11, albumin 5.4, LFTs otherwise unremarkable, total protein 9.5, white count 12,800 differential not obtained, hemoglobin 16.6, hematocrit 49.5, platelets 285,000 Medications and treatments: NS bolus 2 L, Zofran 4 mg IV 1  Review of Systems:  In addition to the HPI above,  No Fever-chills, myalgias or other constitutional symptoms No Headache, changes with Vision or hearing, new weakness, tingling, numbness in any extremity, dizziness, dysarthria or word finding difficulty, gait disturbance or imbalance, tremors or seizure activity No problems swallowing food or  Liquids, indigestion/reflux, choking or coughing while eating, abdominal pain with or after eating No Chest pain, Cough or Shortness of Breath, palpitations, orthopnea or DOE No Abdominal pain, melena,hematochezia, dark tarry stools, constipation No dysuria, malodorous urine, hematuria or flank pain No new skin rashes, lesions, masses or bruises, No new joint pains, aches, swelling or redness No recent unintentional weight gain or loss No polyuria, polydypsia or polyphagia   Past Medical History:  Diagnosis Date  . Eczema DYS  . Osteopenia   . Restless leg syndrome   . Seizure disorder [facility])     Past Surgical History:  Procedure Laterality Date  . ABDOMINAL HYSTERECTOMY      Social History   Social History  . Marital status: Single    Spouse name: N/A  . Number of children: N/A  . Years of education: N/A   Occupational History  . Not on file.   Social History Main Topics  . Smoking status: Never Smoker  . Smokeless tobacco: Never Used  . Alcohol use Yes     Comment: occasionally  . Drug use: No  . Sexual activity: Not on file   Other Topics Concern  . Not on file   Social History Narrative  . No narrative on file    Mobility: Without assistive devices Work history: Not obtained   No Known Allergies   Family history reviewed and not pertinent to current admission findings or diagnosis  Prior to Admission medications   Medication Sig Start Date End Date Taking? Authorizing Provider  Ascorbic Acid (VITAMIN C) 100 MG tablet Take 100 mg by mouth daily.     Yes Historical Provider, MD  conjugated estrogens (PREMARIN) vaginal cream Place 1 Applicatorful vaginally daily. Patient taking differently: Place 1 Applicatorful vaginally every 7 (seven) days. Sundays 03/11/16  Yes Dorena Cookey, MD  diclofenac sodium (VOLTAREN) 1 % GEL Apply 4 g topically 4 (four) times daily. To left knee as  Instructed 01/27/17  Yes Burnis Medin, MD  FLUoxetine (PROZAC) 20 MG capsule  TAKE 1 CAPSULE (20 MG TOTAL) BY MOUTH DAILY. 03/11/16  Yes Dorena Cookey, MD  GRAPE SEED EXTRACT PO Take 1 tablet by mouth daily.    Yes Historical Provider, MD  Naproxen Sodium (ALEVE) 220 MG CAPS Take 220 mg by mouth as needed.   Yes Historical Provider, MD  phenytoin (DILANTIN) 100 MG ER capsule TAKE ONE CAPSULE BY MOUTH TWICE A DAY (NEED APPT) Patient taking differently: Take 200 mg by mouth at bedtime. TAKE ONE CAPSULE BY MOUTH TWICE A DAY (NEED APPT) 03/11/16  Yes Dorena Cookey, MD  pramipexole (MIRAPEX) 0.5 MG tablet Take 1 tablet (0.5 mg total) by mouth at bedtime. 03/11/16  Yes Dorena Cookey, MD    Physical Exam: Vitals:   02/17/17 1058 02/17/17 1215 02/17/17 1230 02/17/17 1245  BP: 100/76 126/74 (!) 115/57 (!) 111/46  Pulse: 99 82 98 82  Resp: 16     Temp: 97.8 F (36.6 C)     TempSrc: Oral     SpO2: 99% 100% 100% 97%      Constitutional: NAD, calm, comfortable Eyes: PERRL, lids and conjunctivae normal ENMT: Mucous membranes are dry. Posterior pharynx clear of any exudate or lesions.Normal dentition.  Neck: normal, supple, no masses, no thyromegaly Respiratory: clear to auscultation bilaterally, no wheezing, no crackles. Normal respiratory effort. No accessory muscle use.  Cardiovascular: Regular rate and rhythm, no murmurs / rubs / gallops. No extremity edema. 2+ pedal pulses. No carotid bruits.  Abdomen: no tenderness, no masses palpated. No hepatosplenomegaly. Bowel sounds positive.  Musculoskeletal: no clubbing / cyanosis. No joint deformity upper and lower extremities. Good ROM, no contractures. Normal muscle tone.  Skin: no rashes, lesions, ulcers. No induration-poor turgor Neurologic: CN 2-12 grossly intact. Sensation intact, DTR normal. Strength 5/5 x all 4 extremities.  Psychiatric: Normal judgment and insight. Alert and oriented x 3. Normal mood.    Labs on Admission: I have personally reviewed following labs and imaging studies  CBC:  Recent Labs Lab  02/17/17 1115  WBC 12.8*  HGB 16.6*  HCT 49.5*  MCV 92.7  PLT 426   Basic Metabolic Panel:  Recent Labs Lab 02/17/17 1115 02/17/17 1232  NA 138  --   K 4.9  --   CL 105  --   CO2 22  --   GLUCOSE 161*  --   BUN 24*  --   CREATININE 1.78*  --   CALCIUM 10.5*  --   MG  --  2.5*   GFR: CrCl cannot be calculated (Unknown ideal weight.). Liver Function Tests:  Recent Labs Lab 02/17/17 1115  AST 27  ALT 21  ALKPHOS 101  BILITOT 0.6  PROT 9.5*  ALBUMIN 5.4*    Recent Labs Lab 02/17/17 1115  LIPASE 49   No results for input(s): AMMONIA in the last 168 hours. Coagulation Profile: No results for input(s): INR, PROTIME in the last 168 hours. Cardiac Enzymes: No results for input(s): CKTOTAL, CKMB, CKMBINDEX, TROPONINI in the last 168 hours. BNP (last 3 results) No results for input(s): PROBNP in the last 8760 hours. HbA1C: No results for input(s): HGBA1C in  the last 72 hours. CBG: No results for input(s): GLUCAP in the last 168 hours. Lipid Profile: No results for input(s): CHOL, HDL, LDLCALC, TRIG, CHOLHDL, LDLDIRECT in the last 72 hours. Thyroid Function Tests: No results for input(s): TSH, T4TOTAL, FREET4, T3FREE, THYROIDAB in the last 72 hours. Anemia Panel: No results for input(s): VITAMINB12, FOLATE, FERRITIN, TIBC, IRON, RETICCTPCT in the last 72 hours. Urine analysis:    Component Value Date/Time   COLORURINE yellow 01/23/2009 0852   APPEARANCEUR Clear 01/23/2009 0852   LABSPEC 1.020 01/23/2009 0852   PHURINE 5.5 01/23/2009 0852   HGBUR negative 01/23/2009 0852   BILIRUBINUR n 03/04/2016 1012   PROTEINUR trace 03/04/2016 1012   UROBILINOGEN 0.2 03/04/2016 1012   UROBILINOGEN 0.2 01/23/2009 0852   NITRITE n 03/04/2016 1012   NITRITE negative 01/23/2009 0852   LEUKOCYTESUR small (1+) (A) 03/04/2016 1012   Sepsis Labs: @LABRCNTIP (procalcitonin:4,lacticidven:4) )No results found for this or any previous visit (from the past 240 hour(s)).    Radiological Exams on Admission: No results found.   Assessment/Plan Principal Problem:   AKI (acute kidney injury)  -Presents with high volume diarrhea with associated nausea and vomiting since 2 AM now with abnormal renal function -Baseline: 19 and 0.84 -Current: 24 and 1.78 -Continue aggressive IV fluid hydration -Repeat labs in a.m. -Hold oral NSAIDs  Active Problems:   Nausea vomiting and diarrhea -Seems related to acute viral gastroenteritis -Chek gastrointestinal stool panel -Continue supportive care (IVFs and anti-emetics) -Clears    Dehydration, severe -Calcium elevated, total protein elevated and hgb/hct elevated -IV fluids as above -Labs in a.m. -ck phosphorus -leg cramps-obtain CK    Seizure disorder  -Continue oral Dilantin -Convert to IV only if develops recurrent nausea/vomiting after dose    Acute hyperglycemia -Likely appropriate stress response -HgbA1c    Depression -Continue Prozac    RLS (restless legs syndrome) -Continue Mirapex at HS      DVT prophylaxis: Lovenox Code Status: Full  Family Communication: Son  Disposition Plan: Home Consults called: None    ELLIS,ALLISON L. ANP-BC [facility] Pager [phone]   If 7PM-7AM, please contact night-coverage [email or web address] Password [email or web address]  02/17/2017, 1:09 PM

## 2017-02-17 NOTE — ED Notes (Signed)
Pt to get urine sample states she "feels normal".

## 2017-02-17 NOTE — ED Notes (Signed)
Pt having severe leg cramps

## 2017-02-17 NOTE — ED Notes (Signed)
Pt ambulated to restroom, no issues. 

## 2017-02-17 NOTE — ED Notes (Signed)
Paged [place] to [place], [place]

## 2017-02-17 NOTE — ED Triage Notes (Signed)
Pt states she woke up this morning with diarrhea around 0200. SHe reports since then she has also had nausea and vomiting. Pt reports it could be something she ate. She also reports now having leg cramps.

## 2017-02-18 DIAGNOSIS — Z90710 Acquired absence of both cervix and uterus: Secondary | ICD-10-CM | POA: Diagnosis not present

## 2017-02-18 DIAGNOSIS — Z79899 Other long term (current) drug therapy: Secondary | ICD-10-CM | POA: Diagnosis not present

## 2017-02-18 DIAGNOSIS — G40909 Epilepsy, unspecified, not intractable, without status epilepticus: Secondary | ICD-10-CM | POA: Diagnosis present

## 2017-02-18 DIAGNOSIS — N179 Acute kidney failure, unspecified: Secondary | ICD-10-CM | POA: Diagnosis present

## 2017-02-18 DIAGNOSIS — A084 Viral intestinal infection, unspecified: Secondary | ICD-10-CM | POA: Diagnosis not present

## 2017-02-18 DIAGNOSIS — M8580 Other specified disorders of bone density and structure, unspecified site: Secondary | ICD-10-CM | POA: Diagnosis present

## 2017-02-18 DIAGNOSIS — A080 Rotaviral enteritis: Secondary | ICD-10-CM | POA: Diagnosis present

## 2017-02-18 DIAGNOSIS — F329 Major depressive disorder, single episode, unspecified: Secondary | ICD-10-CM | POA: Diagnosis present

## 2017-02-18 DIAGNOSIS — G2581 Restless legs syndrome: Secondary | ICD-10-CM | POA: Diagnosis present

## 2017-02-18 DIAGNOSIS — E860 Dehydration: Secondary | ICD-10-CM | POA: Diagnosis present

## 2017-02-18 DIAGNOSIS — R739 Hyperglycemia, unspecified: Secondary | ICD-10-CM | POA: Diagnosis present

## 2017-02-18 LAB — GASTROINTESTINAL PANEL BY PCR, STOOL (REPLACES STOOL CULTURE)
Adenovirus F40/41: NOT DETECTED
Astrovirus: NOT DETECTED
CRYPTOSPORIDIUM: NOT DETECTED
CYCLOSPORA CAYETANENSIS: NOT DETECTED
Campylobacter species: NOT DETECTED
ENTEROAGGREGATIVE E COLI (EAEC): NOT DETECTED
Entamoeba histolytica: NOT DETECTED
Enteropathogenic E coli (EPEC): NOT DETECTED
Enterotoxigenic E coli (ETEC): NOT DETECTED
GIARDIA LAMBLIA: NOT DETECTED
Norovirus GI/GII: NOT DETECTED
Plesimonas shigelloides: NOT DETECTED
ROTAVIRUS A: DETECTED — AB
SALMONELLA SPECIES: NOT DETECTED
SHIGELLA/ENTEROINVASIVE E COLI (EIEC): NOT DETECTED
Sapovirus (I, II, IV, and V): NOT DETECTED
Shiga like toxin producing E coli (STEC): NOT DETECTED
Vibrio cholerae: NOT DETECTED
Vibrio species: NOT DETECTED
YERSINIA ENTEROCOLITICA: NOT DETECTED

## 2017-02-18 LAB — URINE CULTURE

## 2017-02-18 LAB — CBC
HCT: 39.7 % (ref 36.0–46.0)
HEMOGLOBIN: 12.8 g/dL (ref 12.0–15.0)
MCH: 30.4 pg (ref 26.0–34.0)
MCHC: 32.2 g/dL (ref 30.0–36.0)
MCV: 94.3 fL (ref 78.0–100.0)
Platelets: 206 10*3/uL (ref 150–400)
RBC: 4.21 MIL/uL (ref 3.87–5.11)
RDW: 14.1 % (ref 11.5–15.5)
WBC: 8 10*3/uL (ref 4.0–10.5)

## 2017-02-18 LAB — COMPREHENSIVE METABOLIC PANEL
ALK PHOS: 73 U/L (ref 38–126)
ALT: 21 U/L (ref 14–54)
AST: 32 U/L (ref 15–41)
Albumin: 3.6 g/dL (ref 3.5–5.0)
Anion gap: 7 (ref 5–15)
BUN: 19 mg/dL (ref 6–20)
CALCIUM: 8.1 mg/dL — AB (ref 8.9–10.3)
CO2: 18 mmol/L — AB (ref 22–32)
CREATININE: 1.07 mg/dL — AB (ref 0.44–1.00)
Chloride: 114 mmol/L — ABNORMAL HIGH (ref 101–111)
GFR, EST AFRICAN AMERICAN: 58 mL/min — AB (ref >60)
GFR, EST NON AFRICAN AMERICAN: 50 mL/min — AB (ref >60)
Glucose, Bld: 121 mg/dL — ABNORMAL HIGH (ref 65–99)
Potassium: 3.5 mmol/L (ref 3.5–5.1)
Sodium: 139 mmol/L (ref 135–145)
Total Bilirubin: 0.5 mg/dL (ref 0.3–1.2)
Total Protein: 6.8 g/dL (ref 6.5–8.1)

## 2017-02-18 LAB — HEMOGLOBIN A1C
Hgb A1c MFr Bld: 5.7 % — ABNORMAL HIGH (ref 4.8–5.6)
Mean Plasma Glucose: 117 mg/dL

## 2017-02-18 NOTE — Progress Notes (Signed)
Patient ID: Jennifer Mcneil, female   DOB: 04-20-1946, 71 y.o.   MRN: [id number]  PROGRESS NOTE    Jennifer Mcneil  EHO:[id number] DOB: 03/08/1946 DOA: 02/17/2017  PCP: Joycelyn Man, MD   Brief Narrative:  71 y.o. female with medical history significant for seizure disorder, depression, restless leg syndrome, osteopenia. Patient presented with ongoing diarrhea, nausea and vomiting. She became severely weak and dizzy and developed leg cramps. She was hemodynamically stable on admission. Blood work showed WBC count of 12.8, Cr was 1.78, calcium 10.5. GI stool panel is pending.  Assessment & Plan:   Principal Problem: Nausea, vomiting, diarrhea likely from acute gastroenteritis / Dehydration / Leukocytosis  - Likely viral gastroenteritis - GI virus panel is pending - Still has ongoing diarrhea - Continue supportive care with IV fluids and antiemetics as needed  Active Problems:   AKI (acute kidney injury) ([place]) - Due to prerenal etiology, dehydration - Continue IV fluids - Cr improving     Seizure disorder ([facility]) - Continue phenytoin    Depression - Continue Prozac    RLS (restless legs syndrome) - Continue pramipexole    DVT prophylaxis: Lovenox subQ Code Status: full code  Family Communication: no family at the bedside this am Disposition Plan: home once diarrhea improves    Consultants:   None   Procedures:   None   Antimicrobials:   None    Subjective: Still has diarrhea almost every 1-2 hours,  Objective: Vitals:   02/17/17 1708 02/17/17 2339 02/18/17 0535 02/18/17 1439  BP: 107/84 (!) 109/58 (!) 105/56 (!) 113/54  Pulse: 92 78 81 77  Resp: 18 17 17 15   Temp: 98.7 F (37.1 C) 99.4 F (37.4 C) 99.5 F (37.5 C) 99.6 F (37.6 C)  TempSrc:  Oral Oral Oral  SpO2: 97% 99% 97% 95%  Weight: 72.3 kg (159 lb 6.3 oz)     Height: 5' (1.524 m)       Intake/Output Summary (Last 24 hours) at 02/18/17 1443 Last data filed at 02/18/17 0146  Gross per 24  hour  Intake             2000 ml  Output                0 ml  Net             2000 ml   Filed Weights   02/17/17 1708  Weight: 72.3 kg (159 lb 6.3 oz)    Examination:  General exam: Appears calm and comfortable  Respiratory system: Clear to auscultation. Respiratory effort normal. Cardiovascular system: S1 & S2 heard, Rate controlled  Gastrointestinal system: Abdomen is nondistended, soft and nontender. No organomegaly or masses felt. Normal bowel sounds heard. Central nervous system: Alert and oriented. No focal neurological deficits. Extremities: Symmetric 5 x 5 power. Skin: No rashes, lesions or ulcers Psychiatry: Judgement and insight appear normal. Mood & affect appropriate.   Data Reviewed: I have personally reviewed following labs and imaging studies  CBC:  Recent Labs Lab 02/17/17 1115 02/18/17 0934  WBC 12.8* 8.0  HGB 16.6* 12.8  HCT 49.5* 39.7  MCV 92.7 94.3  PLT 285 370   Basic Metabolic Panel:  Recent Labs Lab 02/17/17 1115 02/17/17 1232 02/18/17 0934  NA 138  --  139  K 4.9  --  3.5  CL 105  --  114*  CO2 22  --  18*  GLUCOSE 161*  --  121*  BUN 24*  --  19  CREATININE 1.78*  --  1.07*  CALCIUM 10.5*  --  8.1*  MG  --  2.5*  --   PHOS  --  3.5  --    GFR: Estimated Creatinine Clearance: 41.5 mL/min (A) (by C-G formula based on SCr of 1.07 mg/dL (H)). Liver Function Tests:  Recent Labs Lab 02/17/17 1115 02/18/17 0934  AST 27 32  ALT 21 21  ALKPHOS 101 73  BILITOT 0.6 0.5  PROT 9.5* 6.8  ALBUMIN 5.4* 3.6    Recent Labs Lab 02/17/17 1115  LIPASE 49   No results for input(s): AMMONIA in the last 168 hours. Coagulation Profile: No results for input(s): INR, PROTIME in the last 168 hours. Cardiac Enzymes:  Recent Labs Lab 02/17/17 1232  CKTOTAL 75   BNP (last 3 results) No results for input(s): PROBNP in the last 8760 hours. HbA1C:  Recent Labs  02/17/17 1115  HGBA1C 5.7*   CBG: No results for input(s): GLUCAP in the  last 168 hours. Lipid Profile: No results for input(s): CHOL, HDL, LDLCALC, TRIG, CHOLHDL, LDLDIRECT in the last 72 hours. Thyroid Function Tests: No results for input(s): TSH, T4TOTAL, FREET4, T3FREE, THYROIDAB in the last 72 hours. Anemia Panel: No results for input(s): VITAMINB12, FOLATE, FERRITIN, TIBC, IRON, RETICCTPCT in the last 72 hours. Urine analysis:    Component Value Date/Time   COLORURINE YELLOW 02/17/2017 1450   APPEARANCEUR CLOUDY (A) 02/17/2017 1450   LABSPEC 1.015 02/17/2017 1450   PHURINE 5.0 02/17/2017 1450   GLUCOSEU NEGATIVE 02/17/2017 1450   HGBUR SMALL (A) 02/17/2017 1450   HGBUR negative 01/23/2009 0852   BILIRUBINUR NEGATIVE 02/17/2017 1450   BILIRUBINUR n 03/04/2016 1012   KETONESUR NEGATIVE 02/17/2017 1450   PROTEINUR 30 (A) 02/17/2017 1450   UROBILINOGEN 0.2 03/04/2016 1012   UROBILINOGEN 0.2 01/23/2009 0852   NITRITE NEGATIVE 02/17/2017 1450   LEUKOCYTESUR NEGATIVE 02/17/2017 1450   Sepsis Labs: @LABRCNTIP (procalcitonin:4,lacticidven:4)   Culture, Urine     Status: Abnormal   Collection Time: 02/17/17  2:50 PM  Result Value Ref Range Status   Specimen Description URINE, RANDOM  Final   Special Requests NONE  Final   Culture MULTIPLE SPECIES PRESENT, SUGGEST RECOLLECTION (A)  Final   Report Status 02/18/2017 FINAL  Final      Radiology Studies: No results found.   Scheduled Meds: . enoxaparin (LOVENOX) injection  30 mg Subcutaneous Q24H  . FLUoxetine  20 mg Oral QHS  . loperamide  4 mg Oral Once  . phenytoin  200 mg Oral QHS  . pramipexole  0.5 mg Oral QHS   Continuous Infusions: . sodium chloride 100 mL/hr at 02/18/17 1322     LOS: 0 days    Time spent: 25 minutes  Greater than 50% of the time spent on counseling and coordinating the care.   Leisa Lenz, MD [facility] Pager [phone]  If 7PM-7AM, please contact night-coverage [email or web address] Password [facility] 02/18/2017, 2:43 PM

## 2017-02-18 NOTE — Progress Notes (Signed)
NA rounded on pt and found that pt had BM all over the bed.  She woke pt up to clean it; it was then that pt stated "I wander how it happened". She then reported to NA that she found herself on the floor.  When asked what happened, she stated that she felt noxious and sat on the side of the bed to vomit but did not.  She just went on her knee beside the bed.  It was while in that position that she had BM all over the bed.   Head to to assessment complete.  Pt is alert and oriented.  No pain reported and no skin lesion noted.  Pt stated that "it seems like I was dreaming".  Prior to this incidence pt was alert and oriented and independent. There is no change from her base line.  Bed exit alarm is activated and audible.  Will continue to monitor.

## 2017-02-18 NOTE — Care Management Obs Status (Signed)
[place] NOTIFICATION   Patient Details  Name: Jennifer Mcneil MRN: [id number] Date of Birth: 02/23/1946   Medicare Observation Status Notification Given:  Yes    Sharin Mons, RN 02/18/2017, 3:51 PM

## 2017-02-19 DIAGNOSIS — A080 Rotaviral enteritis: Secondary | ICD-10-CM

## 2017-02-19 LAB — BASIC METABOLIC PANEL
Anion gap: 9 (ref 5–15)
BUN: 13 mg/dL (ref 6–20)
CHLORIDE: 114 mmol/L — AB (ref 101–111)
CO2: 14 mmol/L — ABNORMAL LOW (ref 22–32)
Calcium: 8 mg/dL — ABNORMAL LOW (ref 8.9–10.3)
Creatinine, Ser: 1.04 mg/dL — ABNORMAL HIGH (ref 0.44–1.00)
GFR calc non Af Amer: 52 mL/min — ABNORMAL LOW (ref >60)
Glucose, Bld: 85 mg/dL (ref 65–99)
POTASSIUM: 3.4 mmol/L — AB (ref 3.5–5.1)
SODIUM: 137 mmol/L (ref 135–145)

## 2017-02-19 LAB — CBC
HCT: 36 % (ref 36.0–46.0)
HEMOGLOBIN: 12.2 g/dL (ref 12.0–15.0)
MCH: 30.7 pg (ref 26.0–34.0)
MCHC: 33.9 g/dL (ref 30.0–36.0)
MCV: 90.7 fL (ref 78.0–100.0)
Platelets: 148 10*3/uL — ABNORMAL LOW (ref 150–400)
RBC: 3.97 MIL/uL (ref 3.87–5.11)
RDW: 13.6 % (ref 11.5–15.5)
WBC: 7.3 10*3/uL (ref 4.0–10.5)

## 2017-02-19 MED ORDER — ACETAMINOPHEN 325 MG PO TABS: 650.0000 mg | ORAL_TABLET | Freq: Four times a day (QID) | ORAL | 0 refills | Status: DC | PRN

## 2017-02-19 MED ORDER — POTASSIUM CHLORIDE CRYS ER 20 MEQ PO TBCR
40.0000 meq | EXTENDED_RELEASE_TABLET | Freq: Once | ORAL | Status: AC
  Administered 2017-02-19: 40 meq via ORAL
  Filled 2017-02-19: qty 2

## 2017-02-19 NOTE — Discharge Summary (Signed)
Physician Discharge Summary  Jennifer Mcneil:[id number] DOB: 03-30-1946 DOA: 02/17/2017  PCP: Joycelyn Man, MD  Admit date: 02/17/2017 Discharge date: 02/19/2017  Recommendations for Outpatient Follow-up:  1. Check CBC and BMP during next visit with PCP 2. No changes in medications on discharge.  Discharge Diagnoses:  Principal Problem: Acute viral gastroenteritis due to Rotavirus infection Active Problems: Leukocytosis Nausea vomiting and diarrhea Seizure disorder (HCC) Depression RLS (restless legs syndrome)      Discharge Condition: stable   Diet recommendation: as tolerated   History of present illness:  71 y.o.femalewith medical history significant forseizure disorder,depression, restless leg syndrome, osteopenia. Patient presented with ongoing diarrhea, nausea and vomiting. She became severely weak and dizzy and developed leg cramps. She was hemodynamically stable on admission. Blood work showed WBC count of 12.8, Cr was 1.78, calcium 10.5. GI stool panel was positive for rotavirus.  Hospital Course:   Principal Problem: Nausea, vomiting, diarrhea likely from acute gastroenteritis due to Rotavirus infection / Dehydration / Leukocytosis  - GI virus panel positive for rotavirus - Diarrhea better this am   Active Problems:   AKI (acute kidney injury) ([place]) - Due to prerenal etiology, dehydration - Cr improved with IV fluids     Seizure disorder (HCC) - Continue phenytoin    Depression - Continue Prozac    RLS (restless legs syndrome) - Continue pramipexole    DVT prophylaxis: Lovenox subQ Code Status: full code  Family Communication: no family at the bedside this am   Consultants:   None   Procedures:   None   Antimicrobials:   None     Signed:  Leisa Lenz, MD  [facility] 02/19/2017, 9:31 AM  Pager #: [phone]  Time spent in minutes: less than 30 minutes   Discharge Exam: Vitals:   02/18/17 2148  02/19/17 0515  BP: 104/64 (!) 109/54  Pulse: 84 79  Resp: 16 16  Temp: (!) 101.5 F (38.6 C) 100.3 F (37.9 C)   Vitals:   02/18/17 0535 02/18/17 1439 02/18/17 2148 02/19/17 0515  BP: (!) 105/56 (!) 113/54 104/64 (!) 109/54  Pulse: 81 77 84 79  Resp: 17 15 16 16   Temp: 99.5 F (37.5 C) 99.6 F (37.6 C) (!) 101.5 F (38.6 C) 100.3 F (37.9 C)  TempSrc: Oral Oral Oral Oral  SpO2: 97% 95% 95% 96%  Weight:      Height:        General: Pt is alert, follows commands appropriately, not in acute distress Cardiovascular: Regular rate and rhythm, S1/S2 + Respiratory: Clear to auscultation bilaterally, no wheezing, no crackles, no rhonchi Abdominal: Soft, non tender, non distended, bowel sounds +, no guarding Extremities: no edema, no cyanosis, pulses palpable bilaterally DP and PT Neuro: Grossly nonfocal  Discharge Instructions  Discharge Instructions    Call MD for:  persistant nausea and vomiting    Complete by:  As directed    Call MD for:  redness, tenderness, or signs of infection (pain, swelling, redness, odor or green/yellow discharge around incision site)    Complete by:  As directed    Call MD for:  severe uncontrolled pain    Complete by:  As directed    Diet - low sodium heart healthy    Complete by:  As directed    Increase activity slowly    Complete by:  As directed      Allergies as of 02/19/2017   No Known Allergies     Medication List  TAKE these medications   acetaminophen 325 MG tablet Commonly known as:  TYLENOL Take 2 tablets (650 mg total) by mouth every 6 (six) hours as needed for mild pain (or Fever >/= 101).   ALEVE 220 MG Caps Generic drug:  Naproxen Sodium Take 220 mg by mouth as needed.   conjugated estrogens vaginal cream Commonly known as:  PREMARIN Place 1 Applicatorful vaginally daily. What changed:  when to take this  additional instructions   diclofenac sodium 1 % Gel Commonly known as:  VOLTAREN Apply 4 g topically 4  (four) times daily. To left knee as  Instructed   FLUoxetine 20 MG capsule Commonly known as:  PROZAC TAKE 1 CAPSULE (20 MG TOTAL) BY MOUTH DAILY.   GRAPE SEED EXTRACT PO Take 1 tablet by mouth daily.   phenytoin 100 MG ER capsule Commonly known as:  DILANTIN TAKE ONE CAPSULE BY MOUTH TWICE A DAY (NEED APPT) What changed:  how much to take  how to take this  when to take this  additional instructions   pramipexole 0.5 MG tablet Commonly known as:  MIRAPEX Take 1 tablet (0.5 mg total) by mouth at bedtime.   vitamin C 100 MG tablet Take 100 mg by mouth daily.      Follow-up Information    TODD,JEFFREY ALLEN, MD. Schedule an appointment as soon as possible for a visit in 2 week(s).   Specialty:  Family Medicine Contact information: [place] [address] [phone]            The results of significant diagnostics from this hospitalization (including imaging, microbiology, ancillary and laboratory) are listed below for reference.    Significant Diagnostic Studies: Dg Knee Complete 4 Views Left  Result Date: 01/27/2017 CLINICAL DATA:  Status post fall going up stairs 2 weeks ago. Persistent anterior knee pain. EXAM: LEFT KNEE - COMPLETE 4+ VIEW COMPARISON:  None in PACs FINDINGS: The bones are subjectively adequately mineralized. There is narrowing of the medial joint compartment. There is beaking of the tibial spines. Spurs arise from the articular margins of the medial femoral condyles and medial tibial plateau. Tiny spurs arise from the articular margins of the patella. There is no acute fracture nor dislocation. There is a small suprapatellar effusion. IMPRESSION: There is a small suprapatellar effusion. There is no acute fracture or dislocation. 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 Mild osteoarthritic changes elsewhere manifested chiefly by spurring. Electronically Signed   By: David  Martinique M.D.   On: 01/27/2017  11:37    Microbiology: Recent Results (from the past 240 hour(s))  Culture, Urine     Status: Abnormal   Collection Time: 02/17/17  2:50 PM  Result Value Ref Range Status   Specimen Description URINE, RANDOM  Final   Special Requests NONE  Final   Culture MULTIPLE SPECIES PRESENT, SUGGEST RECOLLECTION (A)  Final   Report Status 02/18/2017 FINAL  Final  Gastrointestinal Panel by PCR , Stool     Status: Abnormal   Collection Time: 02/17/17  6:47 PM  Result Value Ref Range Status   Campylobacter species NOT DETECTED NOT DETECTED Final   Plesimonas shigelloides NOT DETECTED NOT DETECTED Final   Salmonella species NOT DETECTED NOT DETECTED Final   Yersinia enterocolitica NOT DETECTED NOT DETECTED Final   Vibrio species NOT DETECTED NOT DETECTED Final   Vibrio cholerae NOT DETECTED NOT DETECTED Final   Enteroaggregative E coli (EAEC) NOT DETECTED NOT DETECTED Final   Enteropathogenic E  coli (EPEC) NOT DETECTED NOT DETECTED Final   Enterotoxigenic E coli (ETEC) NOT DETECTED NOT DETECTED Final   Shiga like toxin producing E coli (STEC) NOT DETECTED NOT DETECTED Final   Shigella/Enteroinvasive E coli (EIEC) NOT DETECTED NOT DETECTED Final   Cryptosporidium NOT DETECTED NOT DETECTED Final   Cyclospora cayetanensis NOT DETECTED NOT DETECTED Final   Entamoeba histolytica NOT DETECTED NOT DETECTED Final   Giardia lamblia NOT DETECTED NOT DETECTED Final   Adenovirus F40/41 NOT DETECTED NOT DETECTED Final   Astrovirus NOT DETECTED NOT DETECTED Final   Norovirus GI/GII NOT DETECTED NOT DETECTED Final   Rotavirus A DETECTED (A) NOT DETECTED Final   Sapovirus (I, II, IV, and V) NOT DETECTED NOT DETECTED Final     Labs: Basic Metabolic Panel:  Recent Labs Lab 02/17/17 1115 02/17/17 1232 02/18/17 0934 02/19/17 0603  NA 138  --  139 137  K 4.9  --  3.5 3.4*  CL 105  --  114* 114*  CO2 22  --  18* 14*  GLUCOSE 161*  --  121* 85  BUN 24*  --  19 13  CREATININE 1.78*  --  1.07* 1.04*   CALCIUM 10.5*  --  8.1* 8.0*  MG  --  2.5*  --   --   PHOS  --  3.5  --   --    Liver Function Tests:  Recent Labs Lab 02/17/17 1115 02/18/17 0934  AST 27 32  ALT 21 21  ALKPHOS 101 73  BILITOT 0.6 0.5  PROT 9.5* 6.8  ALBUMIN 5.4* 3.6    Recent Labs Lab 02/17/17 1115  LIPASE 49   No results for input(s): AMMONIA in the last 168 hours. CBC:  Recent Labs Lab 02/17/17 1115 02/18/17 0934 02/19/17 0603  WBC 12.8* 8.0 7.3  HGB 16.6* 12.8 12.2  HCT 49.5* 39.7 36.0  MCV 92.7 94.3 90.7  PLT 285 206 148*   Cardiac Enzymes:  Recent Labs Lab 02/17/17 1232  CKTOTAL 75   BNP: BNP (last 3 results) No results for input(s): BNP in the last 8760 hours.  ProBNP (last 3 results) No results for input(s): PROBNP in the last 8760 hours.  CBG: No results for input(s): GLUCAP in the last 168 hours.

## 2017-02-19 NOTE — Discharge Instructions (Signed)
Rotavirus Infection, Adult Rotavirus infection may also be called the stomach flu. This condition is caused by a virus. This virus can be passed from person to person very easily (is very contagious). This condition may affect your stomach, small intestine, and large intestine. It can cause sudden watery diarrhea, fever, and vomiting. Diarrhea and vomiting can make you feel weak and cause you to become dehydrated. You may not be able to keep fluids down. Dehydration can make you tired and thirsty, cause you to have a dry mouth, and decrease how often you urinate. Older adults and people with other diseases or a weak defense system (immune system) are at higher risk for dehydration. It is important to replace the fluids that you lose from diarrhea and vomiting. If you become severely dehydrated, you may need to get fluids through an IV tube. What are the causes? This condition is caused by rotavirus. You can get sick by eating food, drinking water, or touching a surface contaminated with this virus. You can also catch a virus by sharing utensils or other personal items with an infected person. What increases the risk? This condition is more likely to develop in people:  Who have a weak immune system.  Who live with one or more children who are younger than 42 years old.  Who live in a nursing home. What are the signs or symptoms? Symptoms of this condition may occur 1-4 days after you become infected. The most common symptoms are watery diarrhea and vomiting. You may also have fever. Other symptoms may include:  Pain in the abdomen.  Chills.  Weakness.  Nausea.  Loss of appetite. How is this diagnosed? This condition is diagnosed with a medical history and physical exam. You may also have a stool test to check for the virus. How is this treated? This condition typically goes away on its own. The focus of treatment is to restore lost fluids (rehydration). Your health care provider may  recommend that you take an oral rehydration solution (ORS) to replace important salts and minerals (electrolytes) in your body. Severe cases of this condition may require giving fluids through an IV tube. Treatment may also include medicine to help with your symptoms. Follow these instructions at home: Follow instructions from your health care provider about how to care for yourself at home. Eating and drinking  Follow these recommendations as told by your health care provider:  Take an ORS. This is a drink that is sold at pharmacies and retail stores.  Drink clear fluids in small amounts as you are able. Clear fluids include water, ice chips, diluted fruit juice, and low-calorie sports drinks.  Eat bland, easy-to-digest foods in small amounts as you are able. These foods include bananas, applesauce, rice, lean meats, toast, and crackers.  Avoid fluids that contain a lot of sugar or caffeine, such as energy drinks, sports drinks, and soda.  Avoid alcohol.  Avoid spicy or fatty foods. General instructions    Drink enough fluid to keep your urine clear or pale yellow.  Wash your hands often. If soap and water are not available, use hand sanitizer.  Make sure that all people in your household wash their hands well and often.  Take over-the-counter and prescription medicines only as told by your health care provider.  Rest at home while you recover.  Watch your condition for any changes.  Take a warm bath to relieve any burning or pain from frequent diarrhea episodes.  Keep all follow-up visits as told  by your health care provider. This is important. Contact a health care provider if:  You cannot keep fluids down.  Your symptoms get worse.  You have new symptoms.  You feel light-headed or dizzy.  You have muscle cramps. Get help right away if:  You have chest pain.  You feel extremely weak or you faint.  You see blood in your vomit.  Your vomit looks like coffee  grounds.  You have stools that are bloody or black, or stools that look like tar.  You have a severe headache, a stiff neck, or both.  You have a rash.  You have severe pain, cramping, or bloating in your abdomen.  You have trouble breathing or you are breathing very quickly.  Your heart is beating very quickly.  Your skin feels cold and clammy.  You feel confused.  You have pain when you urinate.  You have signs of dehydration, such as:  Dark urine, very little urine, or no urine.  Cracked lips.  Dry mouth.  Sunken eyes.  Sleepiness.  Weakness. This information is not intended to replace advice given to you by your health care provider. Make sure you discuss any questions you have with your health care provider. Document Released: 11/10/2005 Document Revised: 04/15/2016 Document Reviewed: 07/17/2015 Elsevier Interactive Patient Education  2017 [employer].

## 2017-02-19 NOTE — Progress Notes (Signed)
[name] to be D/C'd Home per MD order.  Discussed with the patient and all questions fully answered.  VSS, Skin clean, dry and intact without evidence of skin break down, no evidence of skin tears noted. IV catheter discontinued intact. Site without signs and symptoms of complications. Dressing and pressure applied.  An After Visit Summary was printed and given to the patient. Patient received prescription.  D/c education completed with patient/family including follow up instructions, medication list, d/c activities limitations if indicated, with other d/c instructions as indicated by MD - patient able to verbalize understanding, all questions fully answered.   Patient instructed to return to ED, call 911, or call MD for any changes in condition.   Patient escorted via [place], and D/C home via private auto.  Jennifer Mcneil 02/19/2017 4:30 PM

## 2017-03-08 NOTE — ED Provider Notes (Signed)
[facility] DEPT [facility] Provider Note   CSN: [id number] Arrival date & time: 02/17/17  1047     History   Chief Complaint Chief Complaint  Patient presents with  . Diarrhea  . Emesis    HPI Jennifer Mcneil is a 71 y.o. female.  HPI  71 y.o.femalewith medical history significant forseizure disorder,depression, restless leg syndrome, osteopenia. Patient presented with ongoing diarrhea, nausea and vomiting. Symptoms started 1 day ago and are severe. Pt has associated weakness and dizzy and developed leg cramps. Pt denies sick contacts. No blood in the stools or emesis. Pt has had no antibiotics recently.  Past Medical History:  Diagnosis Date  . Eczema DYS  . GERD (gastroesophageal reflux disease)   . Osteopenia   . Restless leg syndrome   . Seizure disorder ([place])   . Seizures ([place])    "fell out of car when I was 1; seizures started iin my 20's; I've had maybe 5; on daily RX" (02/17/2017)    Patient Active Problem List   Diagnosis Date Noted  . Nausea vomiting and diarrhea 02/17/2017  . Dehydration, severe 02/17/2017  . AKI (acute kidney injury) ([place]) 02/17/2017  . Acute hyperglycemia 02/17/2017  . Viral gastroenteritis   . Routine general medical examination at a health care facility 03/11/2016  . Atrophic vaginitis 03/11/2016  . Contusion 05/17/2015  . RLS (restless legs syndrome) 11/27/2011  . Chronic right shoulder pain 11/27/2011  . Vaginitis, atrophic 06/06/2011  . Depression 06/06/2011  . 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 OSTEOPENIA 04/15/2007  . Seizure disorder ([place]) 04/15/2007    Past Surgical History:  Procedure Laterality Date  . ABDOMINAL HYSTERECTOMY  1988  . UVULOPLASTY      OB History    No data available       Home Medications    Prior to Admission medications   Medication Sig Start Date End Date Taking? Authorizing Provider  Ascorbic Acid (VITAMIN C) 100 MG tablet Take 100 mg by mouth daily.     Yes Historical Provider, MD    conjugated estrogens (PREMARIN) vaginal cream Place 1 Applicatorful vaginally daily. Patient taking differently: Place 1 Applicatorful vaginally every 7 (seven) days. Sundays 03/11/16  Yes Dorena Cookey, MD  diclofenac sodium (VOLTAREN) 1 % GEL Apply 4 g topically 4 (four) times daily. To left knee as  Instructed 01/27/17  Yes Burnis Medin, MD  FLUoxetine (PROZAC) 20 MG capsule TAKE 1 CAPSULE (20 MG TOTAL) BY MOUTH DAILY. 03/11/16  Yes Dorena Cookey, MD  GRAPE SEED EXTRACT PO Take 1 tablet by mouth daily.    Yes Historical Provider, MD  Naproxen Sodium (ALEVE) 220 MG CAPS Take 220 mg by mouth as needed.   Yes Historical Provider, MD  phenytoin (DILANTIN) 100 MG ER capsule TAKE ONE CAPSULE BY MOUTH TWICE A DAY (NEED APPT) Patient taking differently: Take 200 mg by mouth at bedtime. TAKE ONE CAPSULE BY MOUTH TWICE A DAY (NEED APPT) 03/11/16  Yes Dorena Cookey, MD  pramipexole (MIRAPEX) 0.5 MG tablet Take 1 tablet (0.5 mg total) by mouth at bedtime. 03/11/16  Yes Dorena Cookey, MD  acetaminophen (TYLENOL) 325 MG tablet Take 2 tablets (650 mg total) by mouth every 6 (six) hours as needed for mild pain (or Fever >/= 101). 02/19/17   Robbie Lis, MD    Family History History reviewed. No pertinent family history.  Social History Social History  Substance Use Topics  . Smoking status: Current Some Day Smoker  Years: 1.00  . Smokeless tobacco: Never Used     Comment: "smoked in my tenns"  . Alcohol use Yes     Comment: 02/17/2017 "a few glasses of wine/year"     Allergies   Patient has no known allergies.   Review of Systems Review of Systems  Gastrointestinal: Positive for diarrhea and vomiting.  Neurological: Positive for dizziness.  All other systems reviewed and are negative.    Physical Exam Updated Vital Signs BP (!) 109/54 (BP Location: Right Arm)   Pulse 79   Temp 100.3 F (37.9 C) (Oral)   Resp 16   Ht 5' (1.524 m)   Wt 159 lb 6.3 oz (72.3 kg)   SpO2 96%   BMI  31.13 kg/m   Physical Exam  Constitutional: She is oriented to person, place, and time. She appears well-developed and well-nourished.  HENT:  Head: Normocephalic and atraumatic.  Eyes: EOM are normal. Pupils are equal, round, and reactive to light.  Neck: Neck supple.  Cardiovascular: Normal rate, regular rhythm and normal heart sounds.   No murmur heard. Pulmonary/Chest: Effort normal. No respiratory distress.  Abdominal: Soft. She exhibits no distension. There is no tenderness. There is no rebound and no guarding.  Neurological: She is alert and oriented to person, place, and time.  Skin: Skin is warm and dry.  Nursing note and vitals reviewed.    ED Treatments / Results  Labs (all labs ordered are listed, but only abnormal results are displayed) Labs Reviewed  GASTROINTESTINAL PANEL BY PCR, STOOL (REPLACES STOOL CULTURE) - Abnormal; Notable for the following:       Result Value   Rotavirus A DETECTED (*)    All other components within normal limits  URINE CULTURE - Abnormal; Notable for the following:    Culture MULTIPLE SPECIES PRESENT, SUGGEST RECOLLECTION (*)    All other components within normal limits  COMPREHENSIVE METABOLIC PANEL - Abnormal; Notable for the following:    Glucose, Bld 161 (*)    BUN 24 (*)    Creatinine, Ser 1.78 (*)    Calcium 10.5 (*)    Total Protein 9.5 (*)    Albumin 5.4 (*)    GFR calc non Af Amer 27 (*)    GFR calc Af Amer 31 (*)    All other components within normal limits  CBC - Abnormal; Notable for the following:    WBC 12.8 (*)    RBC 5.34 (*)    Hemoglobin 16.6 (*)    HCT 49.5 (*)    All other components within normal limits  URINALYSIS, ROUTINE W REFLEX MICROSCOPIC - Abnormal; Notable for the following:    APPearance CLOUDY (*)    Hgb urine dipstick SMALL (*)    Protein, ur 30 (*)    Squamous Epithelial / LPF 0-5 (*)    All other components within normal limits  MAGNESIUM - Abnormal; Notable for the following:    Magnesium  2.5 (*)    All other components within normal limits  HEMOGLOBIN A1C - Abnormal; Notable for the following:    Hgb A1c MFr Bld 5.7 (*)    All other components within normal limits  COMPREHENSIVE METABOLIC PANEL - Abnormal; Notable for the following:    Chloride 114 (*)    CO2 18 (*)    Glucose, Bld 121 (*)    Creatinine, Ser 1.07 (*)    Calcium 8.1 (*)    GFR calc non Af Amer 50 (*)    GFR  calc Af Amer 58 (*)    All other components within normal limits  CBC - Abnormal; Notable for the following:    Platelets 148 (*)    All other components within normal limits  BASIC METABOLIC PANEL - Abnormal; Notable for the following:    Potassium 3.4 (*)    Chloride 114 (*)    CO2 14 (*)    Creatinine, Ser 1.04 (*)    Calcium 8.0 (*)    GFR calc non Af Amer 52 (*)    All other components within normal limits  LIPASE, BLOOD  PHOSPHORUS  CK  CBC    EKG  EKG Interpretation None       Radiology No results found.  Procedures Procedures (including critical care time)  Medications Ordered in ED Medications  sodium chloride 0.9 % bolus 1,000 mL (0 mLs Intravenous Stopped 02/17/17 1253)  ondansetron (ZOFRAN) injection 4 mg (4 mg Intravenous Given 02/17/17 1253)  sodium chloride 0.9 % bolus 1,000 mL (0 mLs Intravenous Stopped 02/17/17 1500)  potassium chloride SA (K-DUR,KLOR-CON) CR tablet 40 mEq (40 mEq Oral Given 02/19/17 0956)     Initial Impression / Assessment and Plan / ED Course  I have reviewed the triage vital signs and the nursing notes.  Pertinent labs & imaging results that were available during my care of the patient were reviewed by me and considered in my medical decision making (see chart for details).     Pt has AKI due to pre-renal etiology from her n/v/diarrhea. Admit for hydration.  Final Clinical Impressions(s) / ED Diagnoses   Final diagnoses:  AKI (acute kidney injury) [facility])    New Prescriptions Discharge Medication List as of 02/19/2017  3:03 PM      START taking these medications   Details  acetaminophen (TYLENOL) 325 MG tablet Take 2 tablets (650 mg total) by mouth every 6 (six) hours as needed for mild pain (or Fever >/= 101)., Starting Thu 02/19/2017, Normal         Varney Biles, MD 03/08/17 [phone]

## 2017-03-11 ENCOUNTER — Ambulatory Visit: Payer: Medicare HMO | Admitting: Family Medicine
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
## 2017-03-24 ENCOUNTER — Other Ambulatory Visit: Payer: Commercial Managed Care - HMO

## 2017-03-24 ENCOUNTER — Ambulatory Visit: Payer: Medicare HMO

## 2017-03-30 ENCOUNTER — Encounter: Payer: Medicare HMO | Admitting: Family Medicine

## 2017-04-21 ENCOUNTER — Encounter: Payer: Medicare HMO | Admitting: Family Medicine

## 2017-05-21 ENCOUNTER — Emergency Department (HOSPITAL_COMMUNITY)
Admission: EM | Admit: 2017-05-21 | Discharge: 2017-05-21 | Disposition: A | Discharge status: Home / Self Care | Payer: Medicare HMO | Attending: Emergency Medicine | Admitting: Emergency Medicine

## 2017-05-21 ENCOUNTER — Encounter (HOSPITAL_COMMUNITY): Payer: Self-pay | Admitting: *Deleted

## 2017-05-21 DIAGNOSIS — H05012 Cellulitis of left orbit: Secondary | ICD-10-CM | POA: Diagnosis not present

## 2017-05-21 DIAGNOSIS — Z79899 Other long term (current) drug therapy: Secondary | ICD-10-CM | POA: Insufficient documentation

## 2017-05-21 DIAGNOSIS — L03213 Periorbital cellulitis: Secondary | ICD-10-CM | POA: Diagnosis not present

## 2017-05-21 DIAGNOSIS — R21 Rash and other nonspecific skin eruption: Secondary | ICD-10-CM | POA: Diagnosis present

## 2017-05-21 MED ORDER — CLINDAMYCIN HCL 300 MG PO CAPS: 300.0000 mg | ORAL_CAPSULE | Freq: Three times a day (TID) | ORAL | 0 refills | Status: DC

## 2017-05-21 NOTE — ED Triage Notes (Signed)
Patient is alert and oriented x4.  She is being seen for a rash on the left side of her face that has grew in size over the last 5 days.  She denies any pain at this time.

## 2017-05-21 NOTE — ED Notes (Signed)
Pt assessed and discharged by [facility]

## 2017-05-21 NOTE — ED Provider Notes (Signed)
Jennifer Mcneil   CSN: [id number] Arrival date & time: 05/21/17  0973     History   Chief Complaint Chief Complaint  Patient presents with  . Rash    face    HPI Jennifer Mcneil is a 71 y.o. female.  HPI 71 year old Caucasian female with no significant past medical history presents to the emergency Department today with complaints of redness around her left eye. Patient states that she had a pimple there about a week ago. Over the past 4 days redness has spread around her left eye. She also endorses mild swelling. She denies any pain or pruritis. She denies any fever, chills, vision changes, pain with moving her eye, rhinorrhea, any other rash. States that she may have cellulitis. She has not tried anything for her symptoms at home. Past Medical History:  Diagnosis Date  . Eczema DYS  . GERD (gastroesophageal reflux disease)   . Osteopenia   . Restless leg syndrome   . Seizure disorder ([place])   . Seizures ([place])    "fell out of car when I was 1; seizures started iin my 20's; I've had maybe 5; on daily RX" (02/17/2017)    Patient Active Problem List   Diagnosis Date Noted  . Nausea vomiting and diarrhea 02/17/2017  . Dehydration, severe 02/17/2017  . AKI (acute kidney injury) ([place]) 02/17/2017  . Acute hyperglycemia 02/17/2017  . Viral gastroenteritis   . Routine general medical examination at a health care facility 03/11/2016  . Atrophic vaginitis 03/11/2016  . Contusion 05/17/2015  . RLS (restless legs syndrome) 11/27/2011  . Chronic right shoulder pain 11/27/2011  . Vaginitis, atrophic 06/06/2011  . Depression 06/06/2011  . 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 OSTEOPENIA 04/15/2007  . Seizure disorder ([place]) 04/15/2007    Past Surgical History:  Procedure Laterality Date  . ABDOMINAL HYSTERECTOMY  1988  . UVULOPLASTY      OB History    No data available       Home Medications    Prior to Admission medications   Medication Sig Start Date  End Date Taking? Authorizing Provider  acetaminophen (TYLENOL) 325 MG tablet Take 2 tablets (650 mg total) by mouth every 6 (six) hours as needed for mild pain (or Fever >/= 101). 02/19/17   Robbie Lis, MD  Ascorbic Acid (VITAMIN C) 100 MG tablet Take 100 mg by mouth daily.      [provider]  clindamycin (CLEOCIN) 300 MG capsule Take 1 capsule (300 mg total) by mouth 3 (three) times daily. 05/21/17   Ocie Cornfield T, PA-C  conjugated estrogens (PREMARIN) vaginal cream Place 1 Applicatorful vaginally daily. Patient taking differently: Place 1 Applicatorful vaginally every 7 (seven) days. Sundays 03/11/16   Dorena Cookey, MD  diclofenac sodium (VOLTAREN) 1 % GEL Apply 4 g topically 4 (four) times daily. To left knee as  Instructed 01/27/17   Panosh, Standley Brooking, MD  FLUoxetine (PROZAC) 20 MG capsule TAKE 1 CAPSULE (20 MG TOTAL) BY MOUTH DAILY. 03/11/16   Dorena Cookey, MD  GRAPE SEED EXTRACT PO Take 1 tablet by mouth daily.     [provider]  Naproxen Sodium (ALEVE) 220 MG CAPS Take 220 mg by mouth as needed.    [provider]  phenytoin (DILANTIN) 100 MG ER capsule TAKE ONE CAPSULE BY MOUTH TWICE A DAY (NEED APPT) Patient taking differently: Take 200 mg by mouth at bedtime. TAKE ONE CAPSULE BY MOUTH TWICE A DAY (NEED APPT) 03/11/16  Dorena Cookey, MD  pramipexole (MIRAPEX) 0.5 MG tablet Take 1 tablet (0.5 mg total) by mouth at bedtime. 03/11/16   Dorena Cookey, MD    Family History History reviewed. No pertinent family history.  Social History Social History  Substance Use Topics  . Smoking status: Never Smoker  . Smokeless tobacco: Never Used     Comment: "smoked in my tenns"  . Alcohol use Yes     Comment: 02/17/2017 "a few glasses of wine/year"     Allergies   Patient has no known allergies.   Review of Systems Review of Systems  Constitutional: Negative for chills and fever.  Eyes: Negative for photophobia, pain, discharge, redness, itching  and visual disturbance.  Gastrointestinal: Negative for nausea and vomiting.  Skin: Positive for color change and rash.  Neurological: Negative for headaches.     Physical Exam Updated Vital Signs BP 138/83 (BP Location: Left Arm)   Pulse 62   Temp 98 F (36.7 C) (Oral)   Resp 16   Ht 4\' 11"  (1.499 m)   Wt 68 kg (150 lb)   SpO2 99%   BMI 30.30 kg/m   Physical Exam  Constitutional: She appears well-developed and well-nourished. No distress.  HENT:  Head: Normocephalic and atraumatic.  Eyes: Conjunctivae and EOM are normal. Pupils are equal, round, and reactive to light. Right eye exhibits no discharge. Left eye exhibits no discharge. Right conjunctiva is not injected. Right conjunctiva has no hemorrhage. Left conjunctiva is not injected. Left conjunctiva has no hemorrhage. No scleral icterus.  Single vesicle noted to the left lateral face what appears to be a pimple. Erythema that surrounds the left periorbital region. It is not tender to palpation. No fluctuance noted. No further lesions noted. EOMs are normal. Conjunctivae are normal. No proptosis.   Neck: Normal range of motion. Neck supple.  Pulmonary/Chest: No respiratory distress.  Musculoskeletal: Normal range of motion.  Neurological: She is alert.  Skin: No pallor.  Psychiatric: Her behavior is normal. Judgment and thought content normal.  Nursing Mcneil and vitals reviewed.      ED Treatments / Results  Labs (all labs ordered are listed, but only abnormal results are displayed) Labs Reviewed - No data to display  EKG  EKG Interpretation None       Radiology No results found.  Procedures Procedures (including critical care time)  Medications Ordered in ED Medications - No data to display   Initial Impression / Assessment and Plan / ED Course  I have reviewed the triage vital signs and the nursing notes.  Pertinent labs & imaging results that were available during my care of the patient were  reviewed by me and considered in my medical decision making (see chart for details).     Pt presents to the Ed today with complaint of left perobital redness. She denies any pain or lesions. Denies any associated symptoms including nausea, vomiting. Vision is normal. EOMs are normal. The patient's symptoms and consistent with periorbital cellulitis. Doubt zoster is given no pain and no lesions. Doubt orbital cellulitis given no proptosis or pain with EOM. Patient is overall well-appearing. She is nontoxic. She is afebrile. Will treat with antibiotics and follow-up with ophthalmologist if symptoms are not improving.  Pt is hemodynamically stable, in NAD, & able to ambulate in the ED. Evaluation does not show pathology that would require ongoing emergent intervention or inpatient treatment. I explained the diagnosis to the patient. Pain has been managed & has no complaints  prior to dc. Pt is comfortable with above plan and is stable for discharge at this time. All questions were answered prior to disposition. Strict return precautions for f/u to the ED were discussed. Encouraged follow up with PCP. Seen by Dr. Rex Kras who is agreeable to the above plan.  '  Final Clinical Impressions(s) / ED Diagnoses   Final diagnoses:  Periorbital cellulitis of left eye    New Prescriptions New Prescriptions   CLINDAMYCIN (CLEOCIN) 300 MG CAPSULE    Take 1 capsule (300 mg total) by mouth 3 (three) times daily.     Doristine Devoid, PA-C 05/21/17 0836    Doristine Devoid, PA-C 05/21/17 0837    Sharlett Iles, MD 05/21/17 [phone]

## 2017-05-21 NOTE — Discharge Instructions (Signed)
You have been seen today in the Emergency Department for cellulitis, a superficial skin infection. Please take your antibiotics as prescribed for their ENTIRE prescribed duration.   May alternate between motrin and tylenol for fever, pain, and swelling.  Please follow up with your doctor or return to the ER in 2 days for recheck of your infection if you are not improving.  Call your doctor sooner or return to the ER if you develop worsening signs of infection such as: increased redness, increased pain, pus, fever, or other symptoms that concern you.  Follow up with your eye doctor if symptoms do not improve.

## 2017-06-01 ENCOUNTER — Encounter: Payer: Self-pay | Admitting: Family Medicine

## 2017-06-01 ENCOUNTER — Ambulatory Visit (INDEPENDENT_AMBULATORY_CARE_PROVIDER_SITE_OTHER): Payer: Medicare HMO | Admitting: Family Medicine

## 2017-06-01 VITALS — BP 118/88 | HR 68 | Temp 98.5°F | Wt 151.0 lb

## 2017-06-01 DIAGNOSIS — L03213 Periorbital cellulitis: Secondary | ICD-10-CM

## 2017-06-01 NOTE — Patient Instructions (Signed)
Please keep eye doctor appointment tomorrow as scheduled.  If you notice any new or changing symptoms such as swelling, redness, or pain, please follow up for further evaluation and treatment. You do not have orbital cellulitis as we discussed, however information is provided below for you as discussed.     Orbital Cellulitis Orbital cellulitis is an infection in the eye socket (orbit) and the tissues that surround the eye. The infection can spread to the eyelids, eyebrow area, and cheek. It can also cause a pocket of pus to develop around the eye (orbital abscess). In severe cases, the infection can spread to the brain. Orbital cellulitis is a medical emergency. What are the causes? The most common cause of this condition is a bacterial infection. The infection usually spreads to the eye socket from another part of the body. The infection may start in:  The nose or sinuses.  The eyelids.  Facial skin.  The bloodstream.  What increases the risk? This condition is more likely to develop in people who have recently had one of the following:  Upper respiratory infection.  Sinus infection.  Eyelid or facial infection.  Eye injury.  Infection that affects the entire body or the bloodstream (systemic infection).  What are the signs or symptoms? Symptoms of this condition usually start quickly. Symptoms include:  Eye pain that gets worse with eye movement.  Swelling around the eye.  Eye redness.  Bulging of the eye.  Inability to move the eye.  Double vision.  Fever.  How is this diagnosed? This condition may be diagnosed based on your symptoms and an eye exam. You may also have tests to confirm the diagnosis and to check for an orbital abscess. Other tests (cultures) may be done to find out what type of bacteria is causing the infection. Tests may include:  Complete blood count (CBC).  Blood culture.  Nose, sinus, or throat culture.  Imaging studies such as a CT  scan or MRI.  How is this treated? This condition is usually treated in a hospital. Antibiotic medicines are given directly into a vein through an IV tube.  At first, you may get IV antibiotics to kill bacteria that often cause orbital cellulitis (broad spectrum antibiotics).  Your medicine may be changed if cultures suggest that another antibiotic would be better.  If the IV antibiotics are working to treat your infection, you may be switched to oral antibiotics and allowed to go home.  In some cases, surgery may be needed to drain an orbital abscess.  Follow these instructions at home:  Take medicines only as directed by your health care provider.  Take your antibiotic medicine as directed by your health care provider. Finish the antibiotic even if you start to feel better.  Return to your normal activities as directed by your health care provider. Ask your health care provider what activities are safe for you.  Keep all follow-up visits as directed by your health care provider. This is important. Get help right away if:  Your eye pain or swelling returns or it gets worse.  You have any changes in your vision.  You have a fever. This information is not intended to replace advice given to you by your health care provider. Make sure you discuss any questions you have with your health care provider. Document Released: 11/04/2001 Document Revised: 04/17/2016 Document Reviewed: 11/06/2014 Elsevier Interactive Patient Education  [employer].

## 2017-06-01 NOTE — Progress Notes (Signed)
Subjective:    Patient ID: Jennifer Mcneil, female    DOB: July 23, 1946, 71 y.o.   MRN: [id number]  HPI  Jennifer Mcneil is a 71 year old female who presents today for follow up after an ED visit for evaluation and treatment for periorbital cellulitis of her left eye. She presented to the ED 05/21/17 with erythema and mild swelling that occurred after she noticed a pimple in that area 4 days prior.  She was noted to have a single vesicle to the left side of her face that appeared to be a pimple.  Erythema was noted in the periorbital region without tenderness to palpation or fluctuance noted. No proptosis and no further lesions noted. EOMs were normal. Treatment with clindamycin was initiated with instructions to follow up with opthalmology if symptoms worsen.   Today, she denies fever, chills, sweats, pain, vision changes, rhinorrhea, or rash.   She has been adherent and denies adverse effects from antibiotic. She reports feeling "much better" and does not note any further erythema or edema in this area.  She has an appointment with her ophthalmologist tomorrow for evaluation.    Review of Systems  Constitutional: Negative for chills, fatigue and fever.  Eyes: Negative for photophobia, pain, discharge, redness, itching and visual disturbance.  Cardiovascular: Negative for chest pain and palpitations.  Gastrointestinal: Negative for abdominal pain, diarrhea and nausea.  Skin: Negative for rash.   Past Medical History:  Diagnosis Date  . Eczema DYS  . GERD (gastroesophageal reflux disease)   . Osteopenia   . Restless leg syndrome   . Seizure disorder ([place])   . Seizures ([place])    "fell out of car when I was 1; seizures started iin my 20's; I've had maybe 5; on daily RX" (02/17/2017)     Social History   Social History  . Marital status: Divorced    Spouse name: N/A  . Number of children: N/A  . Years of education: N/A   Occupational History  . Not on file.   Social History Main Topics    . Smoking status: Never Smoker  . Smokeless tobacco: Never Used     Comment: "smoked in my tenns"  . Alcohol use Yes     Comment: 02/17/2017 "a few glasses of wine/year"  . Drug use: No  . Sexual activity: Not Currently   Other Topics Concern  . Not on file   Social History Narrative  . No narrative on file    Past Surgical History:  Procedure Laterality Date  . ABDOMINAL HYSTERECTOMY  1988  . UVULOPLASTY      No family history on file.  No Known Allergies  Current Outpatient Prescriptions on File Prior to Visit  Medication Sig Dispense Refill  . acetaminophen (TYLENOL) 325 MG tablet Take 2 tablets (650 mg total) by mouth every 6 (six) hours as needed for mild pain (or Fever >/= 101). 30 tablet 0  . Ascorbic Acid (VITAMIN C) 100 MG tablet Take 100 mg by mouth daily.      Marland Kitchen conjugated estrogens (PREMARIN) vaginal cream Place 1 Applicatorful vaginally daily. (Patient taking differently: Place 1 Applicatorful vaginally every 7 (seven) days. Sundays) 42.5 g 12  . diclofenac sodium (VOLTAREN) 1 % GEL Apply 4 g topically 4 (four) times daily. To left knee as  Instructed 4 Tube 1  . FLUoxetine (PROZAC) 20 MG capsule TAKE 1 CAPSULE (20 MG TOTAL) BY MOUTH DAILY. 100 capsule 3  . GRAPE SEED EXTRACT PO Take 1  tablet by mouth daily.     . Naproxen Sodium (ALEVE) 220 MG CAPS Take 220 mg by mouth as needed.    . phenytoin (DILANTIN) 100 MG ER capsule TAKE ONE CAPSULE BY MOUTH TWICE A DAY (NEED APPT) (Patient taking differently: Take 200 mg by mouth at bedtime. TAKE ONE CAPSULE BY MOUTH TWICE A DAY (NEED APPT)) 180 capsule 4  . pramipexole (MIRAPEX) 0.5 MG tablet Take 1 tablet (0.5 mg total) by mouth at bedtime. 100 tablet 3   Current Facility-Administered Medications on File Prior to Visit  Medication Dose Route Frequency Provider Last Rate Last Dose  . triamcinolone acetonide (KENALOG-40) injection 20 mg  20 mg Other Once Bronson Ing, DPM        BP 118/88 (BP Location: Left Arm,  Patient Position: Sitting, Cuff Size: Normal)   Pulse 68   Temp 98.5 F (36.9 C) (Oral)   Wt 151 lb (68.5 kg)   SpO2 98%   BMI 30.50 kg/m       Objective:   Physical Exam  Constitutional: She is oriented to person, place, and time. She appears well-developed and well-nourished.  HENT:  Right Ear: Tympanic membrane normal.  Left Ear: Tympanic membrane normal.  Nose: No rhinorrhea.  Mouth/Throat: Oropharynx is clear and moist.  Eyes: Conjunctivae and lids are normal. Pupils are equal, round, and reactive to light. No scleral icterus.  Neck: Neck supple.  Cardiovascular: Normal rate and regular rhythm.   Pulmonary/Chest: Effort normal and breath sounds normal. She has no wheezes. She has no rales.  Musculoskeletal: She exhibits no edema.  Lymphadenopathy:    She has no cervical adenopathy.  Neurological: She is alert and oriented to person, place, and time.  Skin: Skin is warm and dry. No rash noted.  Psychiatric: She has a normal mood and affect. Her behavior is normal. Judgment and thought content normal.       Assessment & Plan:  1. Periorbital cellulitis of left eye Resolved; antibiotic completed without adverse effects; advised follow up with ophthalmology as scheduled tomorrow. Follow up for further evaluation if any symptoms are noticed such as swelling, erythema, or pain. While patient does not have orbital cellulitis; discussed this diagnosis and provided written information for patient to review if needed. Advised close follow up if she notices any symptoms mentioned above.  Follow up with PCP as recommended.  Delano Metz, FNP-C

## 2017-06-02 DIAGNOSIS — H524 Presbyopia: Secondary | ICD-10-CM | POA: Diagnosis not present

## 2017-08-04 ENCOUNTER — Encounter: Payer: Self-pay | Admitting: Family Medicine

## 2017-08-04 ENCOUNTER — Ambulatory Visit (INDEPENDENT_AMBULATORY_CARE_PROVIDER_SITE_OTHER): Payer: Medicare HMO | Admitting: Family Medicine

## 2017-08-04 VITALS — BP 96/68 | HR 66 | Temp 98.0°F | Wt 154.0 lb

## 2017-08-04 DIAGNOSIS — Z0000 Encounter for general adult medical examination without abnormal findings: Secondary | ICD-10-CM

## 2017-08-04 DIAGNOSIS — M949 Disorder of cartilage, unspecified: Secondary | ICD-10-CM | POA: Diagnosis not present

## 2017-08-04 DIAGNOSIS — N952 Postmenopausal atrophic vaginitis: Secondary | ICD-10-CM | POA: Diagnosis not present

## 2017-08-04 DIAGNOSIS — Z23 Encounter for immunization: Secondary | ICD-10-CM | POA: Diagnosis not present

## 2017-08-04 DIAGNOSIS — M899 Disorder of bone, unspecified: Secondary | ICD-10-CM

## 2017-08-04 DIAGNOSIS — G40909 Epilepsy, unspecified, not intractable, without status epilepticus: Secondary | ICD-10-CM

## 2017-08-04 DIAGNOSIS — G2581 Restless legs syndrome: Secondary | ICD-10-CM

## 2017-08-04 LAB — CBC WITH DIFFERENTIAL/PLATELET
BASOS ABS: 0 10*3/uL (ref 0.0–0.1)
BASOS PCT: 1 % (ref 0.0–3.0)
EOS ABS: 0.2 10*3/uL (ref 0.0–0.7)
EOS PCT: 4 % (ref 0.0–5.0)
HCT: 38.5 % (ref 36.0–46.0)
Hemoglobin: 12.7 g/dL (ref 12.0–15.0)
LYMPHS ABS: 2.1 10*3/uL (ref 0.7–4.0)
Lymphocytes Relative: 46 % (ref 12.0–46.0)
MCHC: 32.9 g/dL (ref 30.0–36.0)
MCV: 92.6 fl (ref 78.0–100.0)
MONOS PCT: 8.8 % (ref 3.0–12.0)
Monocytes Absolute: 0.4 10*3/uL (ref 0.1–1.0)
NEUTROS ABS: 1.9 10*3/uL (ref 1.4–7.7)
NEUTROS PCT: 40.2 % — AB (ref 43.0–77.0)
PLATELETS: 221 10*3/uL (ref 150.0–400.0)
RBC: 4.15 Mil/uL (ref 3.87–5.11)
RDW: 13.3 % (ref 11.5–15.5)
WBC: 4.6 10*3/uL (ref 4.0–10.5)

## 2017-08-04 LAB — BASIC METABOLIC PANEL
BUN: 16 mg/dL (ref 6–23)
CALCIUM: 9.6 mg/dL (ref 8.4–10.5)
CO2: 29 mEq/L (ref 19–32)
Chloride: 105 mEq/L (ref 96–112)
Creatinine, Ser: 0.81 mg/dL (ref 0.40–1.20)
GFR: 73.57 mL/min (ref >60.00)
Glucose, Bld: 88 mg/dL (ref 70–99)
POTASSIUM: 4.4 meq/L (ref 3.5–5.1)
SODIUM: 142 meq/L (ref 135–145)

## 2017-08-04 LAB — LIPID PANEL
Cholesterol: 230 mg/dL — ABNORMAL HIGH (ref 0–200)
HDL: 55.3 mg/dL (ref >39.00)
LDL CALC: 136 mg/dL — AB (ref 0–99)
NONHDL: 175.05
Total CHOL/HDL Ratio: 4
Triglycerides: 195 mg/dL — ABNORMAL HIGH (ref 0.0–149.0)
VLDL: 39 mg/dL (ref 0.0–40.0)

## 2017-08-04 LAB — HEPATIC FUNCTION PANEL
ALK PHOS: 69 U/L (ref 39–117)
ALT: 16 U/L (ref 0–35)
AST: 18 U/L (ref 0–37)
Albumin: 4.3 g/dL (ref 3.5–5.2)
BILIRUBIN DIRECT: 0.1 mg/dL (ref 0.0–0.3)
Total Bilirubin: 0.4 mg/dL (ref 0.2–1.2)
Total Protein: 7 g/dL (ref 6.0–8.3)

## 2017-08-04 LAB — TSH: TSH: 1.44 u[IU]/mL (ref 0.35–4.50)

## 2017-08-04 MED ORDER — ESTROGENS, CONJUGATED 0.625 MG/GM VA CREA: 1.0000 | TOPICAL_CREAM | Freq: Every day | VAGINAL | 12 refills | Status: DC

## 2017-08-04 MED ORDER — FLUOXETINE HCL 20 MG PO CAPS: ORAL_CAPSULE | ORAL | 4 refills | Status: DC

## 2017-08-04 MED ORDER — PRAMIPEXOLE DIHYDROCHLORIDE 0.5 MG PO TABS: 0.5000 mg | ORAL_TABLET | Freq: Every day | ORAL | 4 refills | Status: DC

## 2017-08-04 MED ORDER — ESOMEPRAZOLE MAGNESIUM 20 MG PO CPDR: 20.0000 mg | DELAYED_RELEASE_CAPSULE | Freq: Every day | ORAL | 4 refills | Status: DC

## 2017-08-04 MED ORDER — PHENYTOIN SODIUM EXTENDED 100 MG PO CAPS: ORAL_CAPSULE | ORAL | 4 refills | Status: DC

## 2017-08-04 NOTE — Patient Instructions (Signed)
Labs today........Marland Kitchen we will call you if there is anything abnormal  Bone density............Marland Kitchen get that set up at the back counter. Takes about 4-6 weeks rest to get a report  Call your gastroenterologist find out when you're due for your colonoscopy  Call your insurance company to find out where he get your shingles vaccine  Call Dr. done Bing Plume ophthalmologist for evaluation of your cataracts.  For your osteoarthritis of your knees we recommend the following........ 400 mg of Motrin twice daily with food elevation and ice at bedtime 20 minutes and nonweightbearing exercise.Marland Kitchen

## 2017-08-04 NOTE — Progress Notes (Signed)
Jennifer Mcneil is a 71 year old single female nonsmoker who comes in today for general physical examination because of the following medical problems  She has a history of reflux esophagitis, postmenopausal vaginal dryness, mild depression, seizure disorder, restless leg syndrome, bilateral knee pain  She takes Nexium 20 mg daily because a history of reflux esophagitis  Uses Premarin cream 3 times weekly because of vaginal dryness. She had her uterus removed and ovaries removed at age 3 for nonmalignant reasons.  She takes Prozac 20 mg daily at bedtime because of a history of mild depression  She takes Dilantin 100 mg twice a day because of a history of seizure disorder. She is asymptomatic no seizures this year. Indeed she had 9 seizures in many years.  She takes Mirapex 0.5 daily at bedtime because of a history of restless leg syndrome.  Her new problem is pain in both knees. She went to see an orthopedist about a month ago. X-rays are normal no history of fractures. She's had shown traumatic falls but again no fractures. She's told she had arthritis. She was given Voltaren cream but it didn't help.  She gets routine eye care, dental care, BSE monthly, annual mammography. However she skipped May 2018. Asked her mammogram this fall. Colonoscopy was in 2000 and it was normal. I'll refer back to GI for colonoscopy.  Vaccinations up-to-date except she's due a tetanus booster. Last tetanus booster was 2004. She's also do the shingles vaccine.  14 point review of systems review is otherwise negative  Social history single lives here in [name] she works out at [employer] 4-5 days per week. She's also do a DEXA scan to rule out osteoporosis.  EKG was done because of a history of seizure disorder and depression and because the medication she's on. EKG was normal and unchanged.  BP 96/68 (BP Location: Left Arm, Patient Position: Sitting, Cuff Size: Normal)   Pulse 66   Temp 98 F (36.7 C) (Oral)   Wt  154 lb (69.9 kg)   BMI 31.10 kg/m  Examination HEENT were negative except for bilateral cataracts. She's been followed by an optometrist. Refer to Dr. Bing Plume. Neck was supple thyroid not enlarged no carotid bruits cardiopulmonary exam normal breast exam normal abdominal exam normal pelvic and rectal not indicated extremities normal skin no peripheral pulses normal except for bilateral swelling of both knees. Consistent with some osteoarthritis.  #1 history of seizure disorder........ continue Dilantin check Dilantin level  #2 osteoarthritis right and left knee...Marland KitchenMarland KitchenMarland Kitchen Motrin 400 twice a day elevation and ice nonweightbearing exercise at the [facility]  #3 reflux esophagitis............. continue Nexium  #4 postmenopausal vaginal dryness........ continue Premarin  #5 history of depression....... continue Prozac  #6 restless leg syndrome..... Continue Mirapex  #7 bilateral cataracts......Marland Kitchen refer to Dr. done Bing Plume ophthalmologist for further evaluation.

## 2017-08-05 LAB — PHENYTOIN LEVEL, TOTAL: Phenytoin, Total: 4.6 mg/L — ABNORMAL LOW (ref 10.0–20.0)

## 2017-08-10 ENCOUNTER — Inpatient Hospital Stay: Admission: RE | Admit: 2017-08-10 | Payer: Medicare HMO | Source: Ambulatory Visit

## 2017-08-14 ENCOUNTER — Encounter: Payer: Self-pay | Admitting: Family Medicine

## 2017-08-25 ENCOUNTER — Ambulatory Visit (INDEPENDENT_AMBULATORY_CARE_PROVIDER_SITE_OTHER)
Admission: RE | Admit: 2017-08-25 | Discharge: 2017-08-25 | Disposition: A | Discharge status: Home / Self Care | Payer: Medicare HMO | Source: Ambulatory Visit | Attending: Family Medicine | Admitting: Family Medicine

## 2017-08-25 DIAGNOSIS — M899 Disorder of bone, unspecified: Secondary | ICD-10-CM

## 2017-08-25 DIAGNOSIS — M949 Disorder of cartilage, unspecified: Secondary | ICD-10-CM

## 2017-09-01 ENCOUNTER — Other Ambulatory Visit: Payer: Medicare HMO

## 2017-11-16 ENCOUNTER — Other Ambulatory Visit: Payer: Self-pay | Admitting: Family Medicine

## 2017-11-16 DIAGNOSIS — Z139 Encounter for screening, unspecified: Secondary | ICD-10-CM

## 2017-12-14 ENCOUNTER — Ambulatory Visit
Admission: RE | Admit: 2017-12-14 | Discharge: 2017-12-14 | Disposition: A | Discharge status: Home / Self Care | Payer: Medicare HMO | Source: Ambulatory Visit | Attending: Family Medicine | Admitting: Family Medicine

## 2017-12-14 DIAGNOSIS — Z139 Encounter for screening, unspecified: Secondary | ICD-10-CM

## 2017-12-14 DIAGNOSIS — Z1231 Encounter for screening mammogram for malignant neoplasm of breast: Secondary | ICD-10-CM | POA: Diagnosis not present

## 2018-04-08 IMAGING — DX DG KNEE COMPLETE 4+V*L*
4 series · 4 of 4 positions shown · non-contrast
Comparison: None in PACs

CLINICAL DATA: Status post fall going up stairs 2 weeks ago.
Persistent anterior knee pain.

EXAM:
LEFT KNEE - COMPLETE 4+ VIEW

[knee ap]
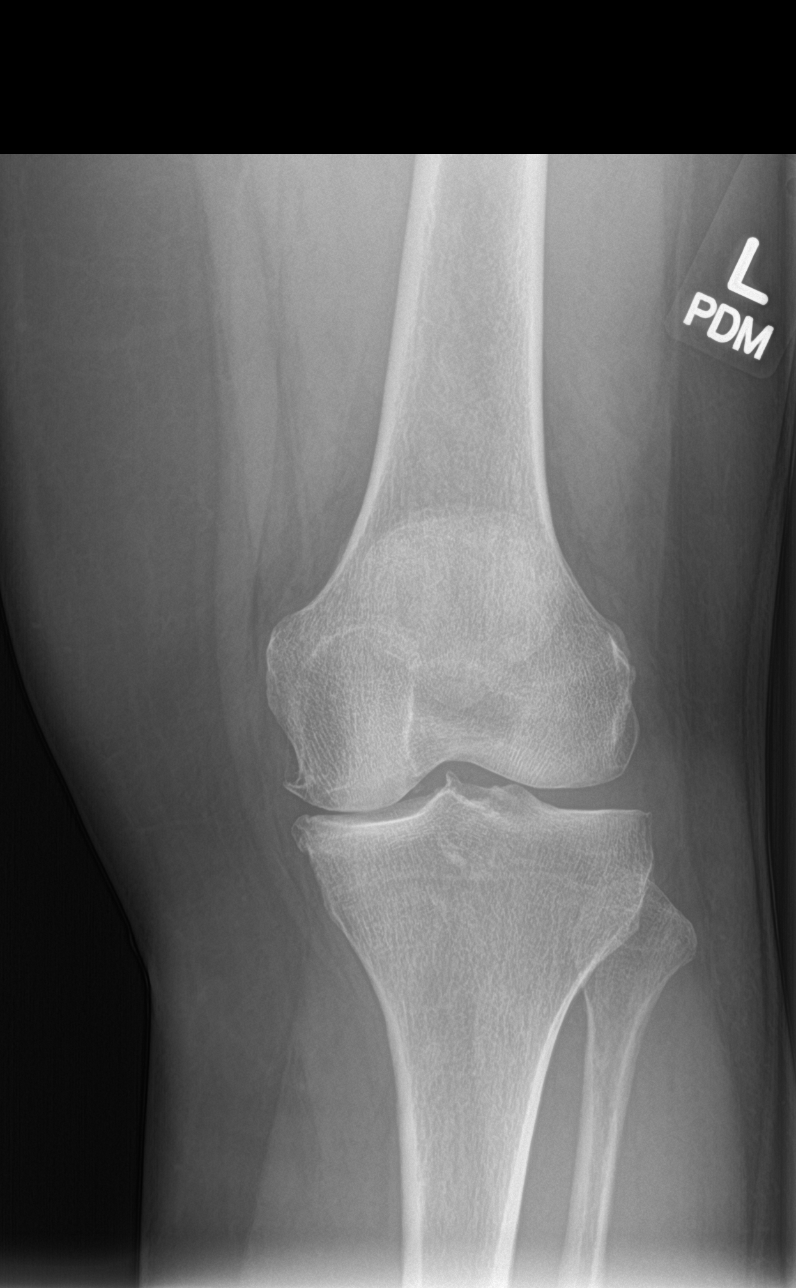

[knee tunnel]
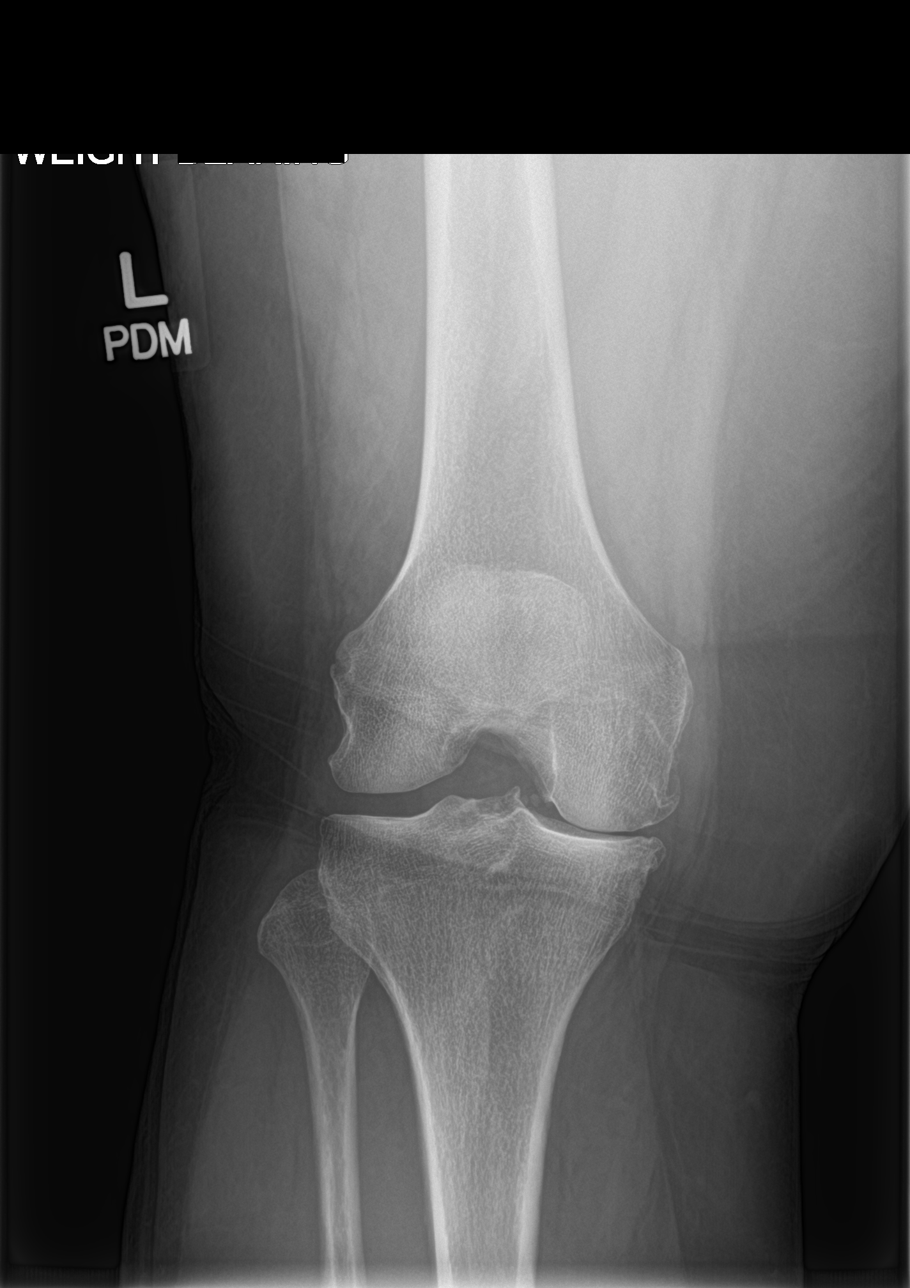

[knee lat]
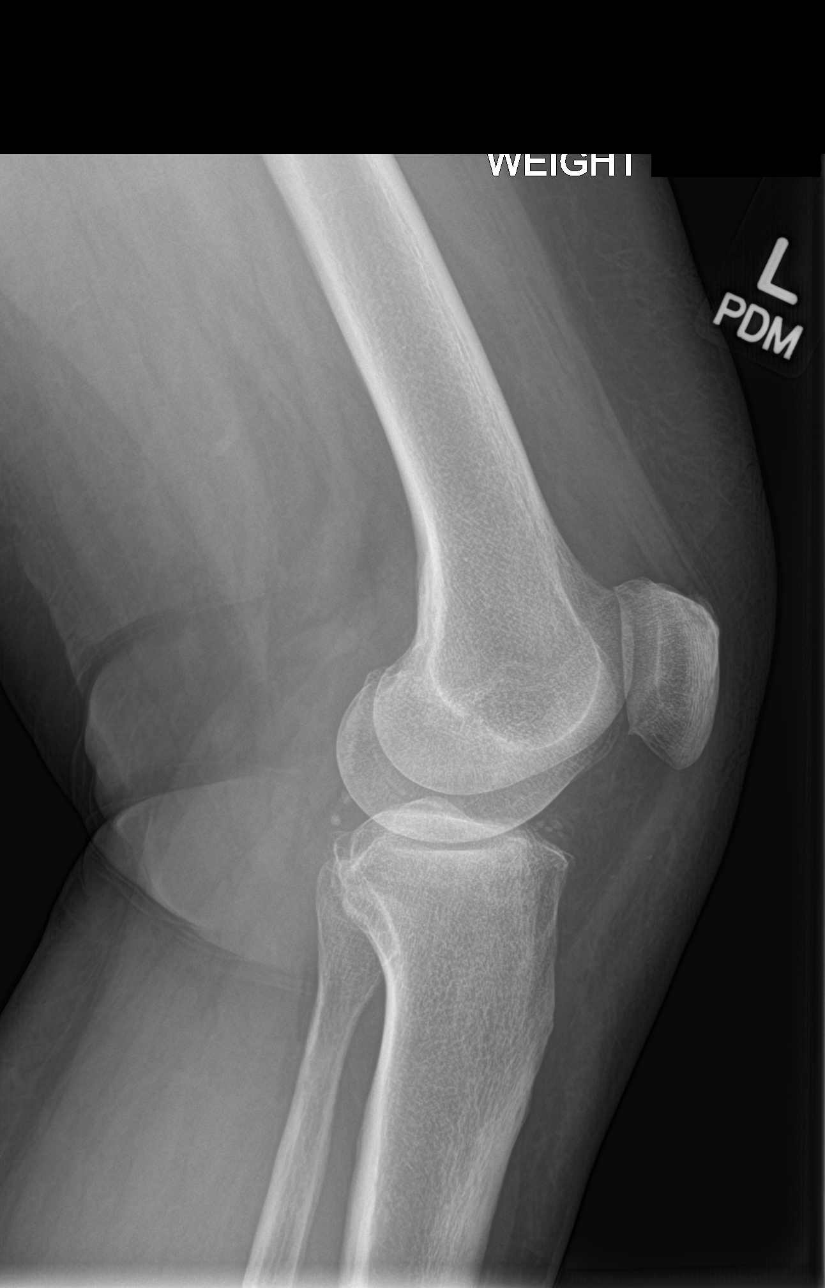

[sunrise]
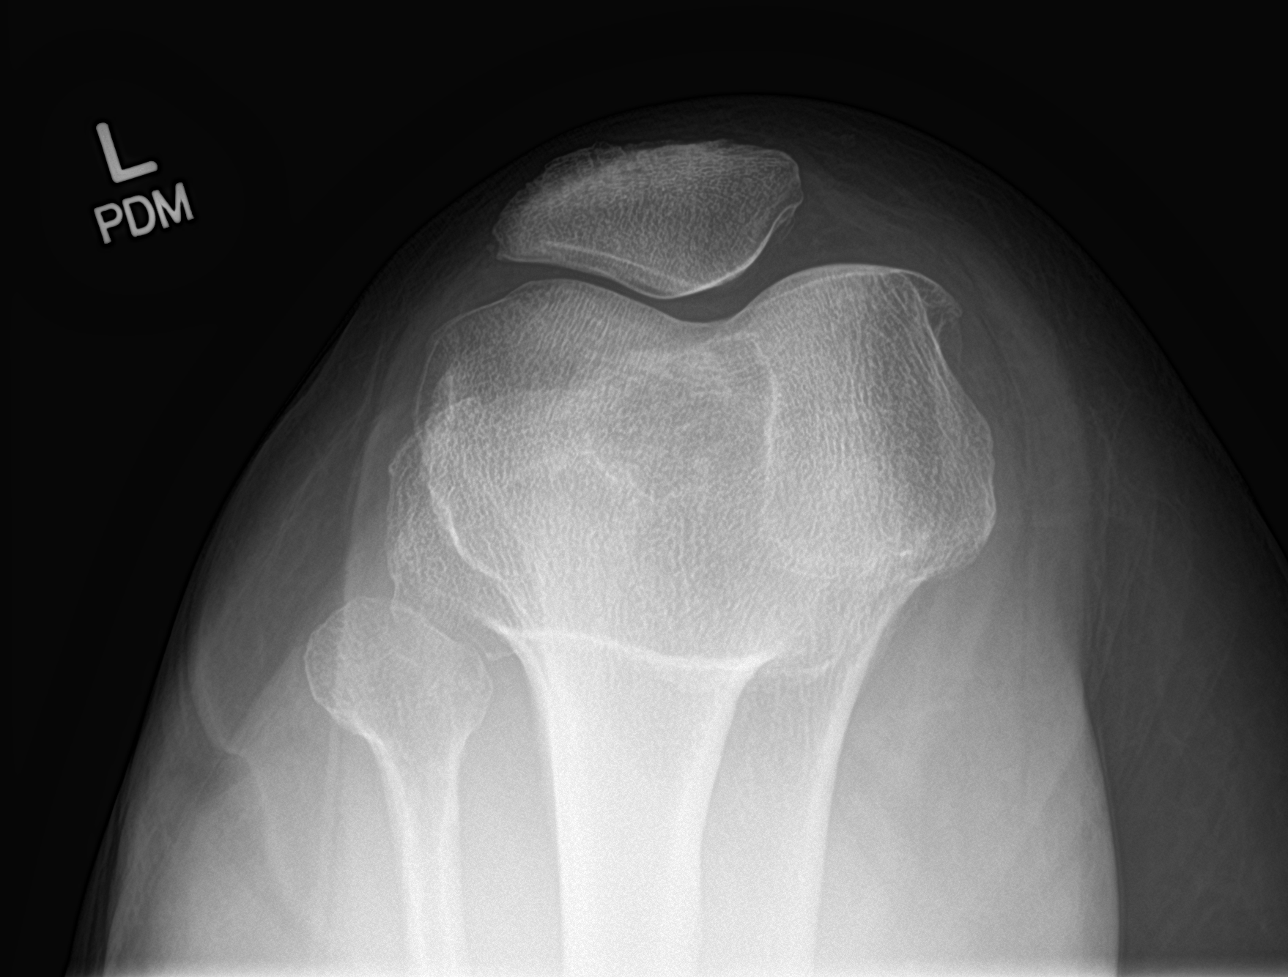

[4 of 4 positions shown; findings below may reference images not displayed]

FINDINGS: The bones are subjectively adequately mineralized. There is
narrowing of the medial joint compartment. There is beaking of the
tibial spines. Spurs arise from the articular margins of the medial
femoral condyles and medial tibial plateau. Tiny spurs arise from
the articular margins of the patella. There is no acute fracture nor
dislocation. There is a small suprapatellar effusion.
IMPRESSION: There is a small suprapatellar effusion. There is no acute fracture
or dislocation.

Moderate osteoarthritic joint space loss of the medial compartment.
Mild osteoarthritic changes elsewhere manifested chiefly by
spurring.

## 2018-04-28 DIAGNOSIS — H5213 Myopia, bilateral: Secondary | ICD-10-CM | POA: Diagnosis not present

## 2018-04-28 DIAGNOSIS — H01002 Unspecified blepharitis right lower eyelid: Secondary | ICD-10-CM | POA: Diagnosis not present

## 2018-04-28 DIAGNOSIS — H25813 Combined forms of age-related cataract, bilateral: Secondary | ICD-10-CM | POA: Diagnosis not present

## 2018-04-28 DIAGNOSIS — H01001 Unspecified blepharitis right upper eyelid: Secondary | ICD-10-CM | POA: Diagnosis not present

## 2018-04-28 DIAGNOSIS — H01004 Unspecified blepharitis left upper eyelid: Secondary | ICD-10-CM | POA: Diagnosis not present

## 2018-04-28 DIAGNOSIS — H52223 Regular astigmatism, bilateral: Secondary | ICD-10-CM | POA: Diagnosis not present

## 2018-04-28 DIAGNOSIS — H524 Presbyopia: Secondary | ICD-10-CM | POA: Diagnosis not present

## 2018-08-25 ENCOUNTER — Ambulatory Visit (INDEPENDENT_AMBULATORY_CARE_PROVIDER_SITE_OTHER): Payer: Medicare HMO | Admitting: Family Medicine

## 2018-08-25 ENCOUNTER — Encounter: Payer: Self-pay | Admitting: Family Medicine

## 2018-08-25 VITALS — BP 122/78 | HR 70 | Temp 98.5°F | Wt 151.7 lb

## 2018-08-25 DIAGNOSIS — G2581 Restless legs syndrome: Secondary | ICD-10-CM

## 2018-08-25 DIAGNOSIS — G40909 Epilepsy, unspecified, not intractable, without status epilepticus: Secondary | ICD-10-CM

## 2018-08-25 DIAGNOSIS — F329 Major depressive disorder, single episode, unspecified: Secondary | ICD-10-CM

## 2018-08-25 DIAGNOSIS — Z23 Encounter for immunization: Secondary | ICD-10-CM | POA: Diagnosis not present

## 2018-08-25 DIAGNOSIS — Z0000 Encounter for general adult medical examination without abnormal findings: Secondary | ICD-10-CM

## 2018-08-25 DIAGNOSIS — F32A Depression, unspecified: Secondary | ICD-10-CM

## 2018-08-25 LAB — CBC WITH DIFFERENTIAL/PLATELET
Basophils Absolute: 0 10*3/uL (ref 0.0–0.1)
Basophils Relative: 0.8 % (ref 0.0–3.0)
EOS ABS: 0.1 10*3/uL (ref 0.0–0.7)
Eosinophils Relative: 2.8 % (ref 0.0–5.0)
HCT: 36.6 % (ref 36.0–46.0)
HEMOGLOBIN: 12.5 g/dL (ref 12.0–15.0)
LYMPHS ABS: 1.8 10*3/uL (ref 0.7–4.0)
Lymphocytes Relative: 46.6 % — ABNORMAL HIGH (ref 12.0–46.0)
MCHC: 34.1 g/dL (ref 30.0–36.0)
MCV: 91 fl (ref 78.0–100.0)
Monocytes Absolute: 0.3 10*3/uL (ref 0.1–1.0)
Monocytes Relative: 8.4 % (ref 3.0–12.0)
NEUTROS PCT: 41.4 % — AB (ref 43.0–77.0)
Neutro Abs: 1.6 10*3/uL (ref 1.4–7.7)
Platelets: 233 10*3/uL (ref 150.0–400.0)
RBC: 4.03 Mil/uL (ref 3.87–5.11)
RDW: 13.3 % (ref 11.5–15.5)
WBC: 3.9 10*3/uL — AB (ref 4.0–10.5)

## 2018-08-25 LAB — BASIC METABOLIC PANEL
BUN: 21 mg/dL (ref 6–23)
CO2: 28 meq/L (ref 19–32)
CREATININE: 1.01 mg/dL (ref 0.40–1.20)
Calcium: 9.4 mg/dL (ref 8.4–10.5)
Chloride: 102 mEq/L (ref 96–112)
GFR: 56.86 mL/min — AB (ref >60.00)
GLUCOSE: 95 mg/dL (ref 70–99)
Potassium: 4.4 mEq/L (ref 3.5–5.1)
Sodium: 139 mEq/L (ref 135–145)

## 2018-08-25 LAB — POCT URINALYSIS DIPSTICK
BILIRUBIN UA: NEGATIVE
GLUCOSE UA: NEGATIVE
Ketones, UA: NEGATIVE
LEUKOCYTES UA: NEGATIVE
Nitrite, UA: NEGATIVE
PH UA: 6 (ref 5.0–8.0)
Protein, UA: NEGATIVE
RBC UA: NEGATIVE
SPEC GRAV UA: 1.02 (ref 1.010–1.025)
UROBILINOGEN UA: 0.2 U/dL

## 2018-08-25 LAB — HEPATIC FUNCTION PANEL
ALT: 11 U/L (ref 0–35)
AST: 14 U/L (ref 0–37)
Albumin: 4.3 g/dL (ref 3.5–5.2)
Alkaline Phosphatase: 69 U/L (ref 39–117)
BILIRUBIN DIRECT: 0.1 mg/dL (ref 0.0–0.3)
BILIRUBIN TOTAL: 0.4 mg/dL (ref 0.2–1.2)
Total Protein: 7.2 g/dL (ref 6.0–8.3)

## 2018-08-25 LAB — LIPID PANEL
Cholesterol: 215 mg/dL — ABNORMAL HIGH (ref 0–200)
HDL: 60.6 mg/dL (ref >39.00)
NonHDL: 154.6
Total CHOL/HDL Ratio: 4
Triglycerides: 201 mg/dL — ABNORMAL HIGH (ref 0.0–149.0)
VLDL: 40.2 mg/dL — AB (ref 0.0–40.0)

## 2018-08-25 LAB — LDL CHOLESTEROL, DIRECT: Direct LDL: 126 mg/dL

## 2018-08-25 LAB — TSH: TSH: 2.06 u[IU]/mL (ref 0.35–4.50)

## 2018-08-25 MED ORDER — FLUOXETINE HCL 20 MG PO CAPS: ORAL_CAPSULE | ORAL | 4 refills | Status: DC

## 2018-08-25 MED ORDER — ESOMEPRAZOLE MAGNESIUM 20 MG PO CPDR: 20.0000 mg | DELAYED_RELEASE_CAPSULE | Freq: Every day | ORAL | 4 refills | Status: DC

## 2018-08-25 MED ORDER — PHENYTOIN SODIUM EXTENDED 100 MG PO CAPS: ORAL_CAPSULE | ORAL | 4 refills | Status: DC

## 2018-08-25 MED ORDER — PRAMIPEXOLE DIHYDROCHLORIDE 0.5 MG PO TABS: 0.5000 mg | ORAL_TABLET | Freq: Every day | ORAL | 4 refills | Status: AC
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
## 2018-08-25 NOTE — Patient Instructions (Addendum)
Labs today...........Marland Kitchen we will call if there is anything abnormal  Continue current meds  Follow-up in 1 year or sooner if any problems  Consider a follow-up colonoscopy  Call your insurance company to find that we can get the new shingles vaccine

## 2018-08-26 LAB — PHENYTOIN LEVEL, TOTAL: Phenytoin, Total: 8.3 mg/L — ABNORMAL LOW (ref 10.0–20.0)

## 2018-12-02 ENCOUNTER — Other Ambulatory Visit: Payer: Self-pay | Admitting: *Deleted

## 2018-12-17 DIAGNOSIS — H01004 Unspecified blepharitis left upper eyelid: Secondary | ICD-10-CM | POA: Diagnosis not present

## 2018-12-17 DIAGNOSIS — H01002 Unspecified blepharitis right lower eyelid: Secondary | ICD-10-CM | POA: Diagnosis not present

## 2018-12-17 DIAGNOSIS — H524 Presbyopia: Secondary | ICD-10-CM | POA: Diagnosis not present

## 2018-12-17 DIAGNOSIS — H01001 Unspecified blepharitis right upper eyelid: Secondary | ICD-10-CM | POA: Diagnosis not present

## 2018-12-17 DIAGNOSIS — H25813 Combined forms of age-related cataract, bilateral: Secondary | ICD-10-CM | POA: Diagnosis not present

## 2019-01-12 DIAGNOSIS — H2511 Age-related nuclear cataract, right eye: Secondary | ICD-10-CM | POA: Diagnosis not present

## 2019-01-12 DIAGNOSIS — H2513 Age-related nuclear cataract, bilateral: Secondary | ICD-10-CM | POA: Diagnosis not present

## 2019-01-26 ENCOUNTER — Other Ambulatory Visit: Payer: Self-pay | Admitting: Internal Medicine

## 2019-01-26 DIAGNOSIS — Z0000 Encounter for general adult medical examination without abnormal findings: Secondary | ICD-10-CM

## 2019-01-26 DIAGNOSIS — G40909 Epilepsy, unspecified, not intractable, without status epilepticus: Secondary | ICD-10-CM

## 2019-01-26 NOTE — Telephone Encounter (Signed)
Pt requesting refills of Dilantin and Prozac, last filled by Dr. Sherren Mocha. Called patient to schedule TOC appt with Dr. Jerilee Hoh. Pt refuses, angry affect. States she has eye surgery scheduled and had been with Dr. Sherren Mocha for 20 years. States "I just saw him in October, I will not come in again."   States if prescription is not filled without her coming in she will change practices. Attempted to explain need for TOC secured, pt continues to interrupt explanation. Assured [place] would route to practice with request.

## 2019-01-26 NOTE — Telephone Encounter (Signed)
Patient called, left VM to return the call to the office to schedule and appointment with a new provider, since Dr. Sherren Mocha is no longer at the practice.

## 2019-01-26 NOTE — Telephone Encounter (Signed)
Copied from [place] [id number]. Topic: Quick Communication - Rx Refill/Question >> Jan 26, 2019  5:21 PM Mcneil, Ja-Kwan wrote: Medication: FLUoxetine (PROZAC) 20 MG capsule and phenytoin (DILANTIN) 100 MG ER capsule  Has the patient contacted their pharmacy? yes   Preferred Pharmacy (with phone number or street name): [place], [place] [phone] (Phone)  [phone] (Fax)  Agent: Please be advised that RX refills may take up to 3 business days. We ask that you follow-up with your pharmacy.

## 2019-01-27 MED ORDER — PHENYTOIN SODIUM EXTENDED 100 MG PO CAPS: ORAL_CAPSULE | ORAL | 0 refills | Status: DC

## 2019-01-27 MED ORDER — FLUOXETINE HCL 20 MG PO CAPS: ORAL_CAPSULE | ORAL | 0 refills | Status: DC

## 2019-01-27 NOTE — Telephone Encounter (Signed)
Spoke with patient.  TOC appointment made.  Refills sent.

## 2019-02-01 DIAGNOSIS — H268 Other specified cataract: Secondary | ICD-10-CM | POA: Diagnosis not present

## 2019-02-01 DIAGNOSIS — H2511 Age-related nuclear cataract, right eye: Secondary | ICD-10-CM | POA: Diagnosis not present

## 2019-02-01 DIAGNOSIS — H25811 Combined forms of age-related cataract, right eye: Secondary | ICD-10-CM | POA: Diagnosis not present

## 2019-02-10 DIAGNOSIS — H2512 Age-related nuclear cataract, left eye: Secondary | ICD-10-CM | POA: Diagnosis not present

## 2019-02-15 ENCOUNTER — Encounter: Payer: Medicare HMO | Admitting: Internal Medicine

## 2019-03-23 ENCOUNTER — Other Ambulatory Visit: Payer: Self-pay

## 2019-03-23 ENCOUNTER — Encounter: Payer: Self-pay | Admitting: Internal Medicine

## 2019-03-23 ENCOUNTER — Ambulatory Visit (INDEPENDENT_AMBULATORY_CARE_PROVIDER_SITE_OTHER): Payer: Medicare HMO | Admitting: Internal Medicine

## 2019-03-23 DIAGNOSIS — G40909 Epilepsy, unspecified, not intractable, without status epilepticus: Secondary | ICD-10-CM

## 2019-03-23 DIAGNOSIS — K219 Gastro-esophageal reflux disease without esophagitis: Secondary | ICD-10-CM | POA: Diagnosis not present

## 2019-03-23 DIAGNOSIS — F329 Major depressive disorder, single episode, unspecified: Secondary | ICD-10-CM

## 2019-03-23 DIAGNOSIS — G2581 Restless legs syndrome: Secondary | ICD-10-CM | POA: Diagnosis not present

## 2019-03-23 DIAGNOSIS — F32A Depression, unspecified: Secondary | ICD-10-CM

## 2019-03-23 NOTE — Progress Notes (Signed)
Virtual Visit via Telephone Note  I connected with Jennifer Mcneil on 03/23/19 at  8:30 AM EDT by telephone and verified that I am speaking with the correct person using two identifiers.   I discussed the limitations, risks, security and privacy concerns of performing an evaluation and management service by telephone and the availability of in person appointments. I also discussed with the patient that there may be a patient responsible charge related to this service. The patient expressed understanding and agreed to proceed.  We initially attempted to connect via video chat but were unable to due to technical difficulties on the patient's end, so we converted this visit to a phone visit.   Location patient: home Location provider: work office Participants present for the call: patient, provider Patient did not have a visit in the prior 7 days to address this/these issue(s).   History of Present Illness:  This visit is to establish care and to follow-up on chronic medical conditions.  Past medical history significant for:  1.  Restless leg syndrome on pramipexole which she uses 3-4 times a week.  2.  GERD on Nexium, very symptomatic when off PPI therapy.  3.  Depression with stable mood on Prozac.  4.  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  She is a never smoker, drinks alcohol occasionally for social events, once or twice a month.  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  She describes herself as being healthy.  She has no acute complaints today.  She had her physical in November 2019.   Observations/Objective: Patient sounds cheerful and well on the phone. I do not appreciate any increased work of breathing. Speech and thought processing are grossly intact. Patient reported  vitals: None reported   Current Outpatient Medications:  .  Ascorbic Acid (VITAMIN C) 100 MG tablet, Take 100 mg by mouth daily.  , Disp: , Rfl:  .  esomeprazole (NEXIUM) 20 MG capsule, Take 1 capsule (20 mg total) by mouth daily at 12 noon., Disp: 100 capsule, Rfl: 4 .  FLUoxetine (PROZAC) 20 MG capsule, TAKE 1 CAPSULE (20 MG TOTAL) BY MOUTH DAILY., Disp: 90 capsule, Rfl: 0 .  GRAPE SEED EXTRACT PO, Take 1 tablet by mouth daily. , Disp: , Rfl:  .  Misc Natural Products (OSTEO BI-FLEX JOINT SHIELD PO), Take by mouth., Disp: , Rfl:  .  Naproxen Sodium (ALEVE) 220 MG CAPS, Take 220 mg by mouth as needed., Disp: , Rfl:  .  phenytoin (DILANTIN) 100 MG ER capsule, TAKE ONE CAPSULE BY MOUTH TWICE A DAY, Disp: 180 capsule, Rfl: 0 .  pramipexole (MIRAPEX) 0.5 MG tablet, Take 1 tablet (0.5 mg total) by mouth at bedtime., Disp: 100 tablet, Rfl: 4 .  Vitamins A & D (VITAMIN A & D) 10000-400 units TABS, , Disp: , Rfl:   Current Facility-Administered Medications:  .  triamcinolone acetonide (KENALOG-40) injection 20 mg, 20 mg, Other, Once, Bronson Ing, DPM  Review of Systems:  Constitutional: Denies fever, chills, diaphoresis, appetite change and fatigue.  HEENT: Denies photophobia, eye pain, redness, hearing loss, ear pain, congestion, sore throat, rhinorrhea, sneezing, mouth sores, trouble swallowing, neck pain, neck stiffness and tinnitus.   Respiratory: Denies SOB, DOE, cough, chest tightness,  and wheezing.   Cardiovascular:  Denies chest pain, palpitations and leg swelling.  Gastrointestinal: Denies nausea, vomiting, abdominal pain, diarrhea, constipation, blood in stool and abdominal distention.  Genitourinary: Denies dysuria, urgency, frequency, hematuria, flank pain and difficulty urinating.  Endocrine: Denies: hot or cold intolerance, sweats, changes in hair or nails, polyuria, polydipsia. Musculoskeletal: Denies myalgias, back pain, joint swelling, arthralgias and gait problem.  Skin:  Denies pallor, rash and wound.  Neurological: Denies dizziness, seizures, syncope, weakness, light-headedness, numbness and headaches.  Hematological: Denies adenopathy. Easy bruising, personal or family bleeding history  Psychiatric/Behavioral: Denies suicidal ideation, mood changes, confusion, nervousness, sleep disturbance and agitation   Assessment and Plan:  RLS (restless legs syndrome) -Well-controlled on pramipexole.  Seizure disorder ([place]) -Remains on phenytoin, has not had breakthrough seizures in years, is not interested in trying to wean off seizure medication.  Depression, unspecified depression type -Mood is stable on Zoloft, is not interested in CBT at the moment.  GERD -Symptoms are well controlled on Nexium.    I discussed the assessment and treatment plan with the patient. The patient was provided an opportunity to ask questions and all were answered. The patient agreed with the plan and demonstrated an understanding of the instructions.   The patient was advised to call back or seek an in-person evaluation if the symptoms worsen or if the condition fails to improve as anticipated.  I provided 24 minutes of non-face-to-face time during this encounter.   Lelon Frohlich, MD [facility]

## 2019-04-19 DIAGNOSIS — H268 Other specified cataract: Secondary | ICD-10-CM | POA: Diagnosis not present

## 2019-04-19 DIAGNOSIS — H25812 Combined forms of age-related cataract, left eye: Secondary | ICD-10-CM | POA: Diagnosis not present

## 2019-04-19 DIAGNOSIS — H2512 Age-related nuclear cataract, left eye: Secondary | ICD-10-CM | POA: Diagnosis not present

## 2019-05-06 ENCOUNTER — Other Ambulatory Visit: Payer: Self-pay

## 2019-05-06 ENCOUNTER — Ambulatory Visit (INDEPENDENT_AMBULATORY_CARE_PROVIDER_SITE_OTHER): Payer: Medicare HMO | Admitting: Internal Medicine

## 2019-05-06 DIAGNOSIS — M542 Cervicalgia: Secondary | ICD-10-CM | POA: Diagnosis not present
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
## 2019-05-13 ENCOUNTER — Telehealth: Payer: Self-pay | Admitting: Internal Medicine

## 2019-05-13 DIAGNOSIS — Z0000 Encounter for general adult medical examination without abnormal findings: Secondary | ICD-10-CM

## 2019-05-13 NOTE — Telephone Encounter (Signed)
Medication Refill - Medication: FLUoxetine (PROZAC) 20 MG capsule ,phenytoin (DILANTIN) 100 MG ER capsule    Has the patient contacted their pharmacy? Yes (Agent: If no, request that the patient contact the pharmacy for the refill.) (Agent: If yes, when and what did the pharmacy advise?)Contact PCP  Preferred Pharmacy (with phone number or street name):  [id number] [place], [place] [phone] (Phone) [phone] (Fax)     Agent: Please be advised that RX refills may take up to 3 business days. We ask that you follow-up with your pharmacy.

## 2019-05-17 MED ORDER — FLUOXETINE HCL 20 MG PO CAPS: ORAL_CAPSULE | ORAL | 1 refills | Status: DC

## 2019-05-18 ENCOUNTER — Telehealth: Payer: Self-pay | Admitting: Internal Medicine

## 2019-05-18 DIAGNOSIS — G40909 Epilepsy, unspecified, not intractable, without status epilepticus: Secondary | ICD-10-CM

## 2019-05-18 MED ORDER — PHENYTOIN SODIUM EXTENDED 100 MG PO CAPS: ORAL_CAPSULE | ORAL | 0 refills | Status: DC

## 2019-05-18 NOTE — Telephone Encounter (Signed)
Pt request refill  phenytoin (DILANTIN) 100 MG ER capsule  Pt had virtual 6/12 and thought it was refilled at that time, but not at the pharmacy.  [id number] [address], [place] [phone] (Phone) [phone] (Fax)   Pt has only a few left and cannot go w/out this medication.

## 2019-05-18 NOTE — Telephone Encounter (Signed)
OK to refill. Next time, ask her not to wait until she is out to request refills :)

## 2019-05-18 NOTE — Telephone Encounter (Signed)
Refill sent.

## 2019-06-10 DIAGNOSIS — Z961 Presence of intraocular lens: Secondary | ICD-10-CM | POA: Diagnosis not present

## 2019-08-30 ENCOUNTER — Encounter: Payer: Medicare HMO | Admitting: Internal Medicine

## 2019-09-20 ENCOUNTER — Telehealth: Payer: Self-pay

## 2019-09-20 ENCOUNTER — Encounter: Payer: Medicare HMO | Admitting: Internal Medicine

## 2019-09-20 NOTE — Telephone Encounter (Signed)
Pt called from the parking lot at 10:48 am to be pre-screened for her appt, however the appt was scheduled for 10:30 am. The pt was advised by [facility] that she was now past the appt time and would need to reschedule. The patient immediately told LaWana "You are full of sh*t". LaWana then brought the phone to me and asked me to speak with the patient. I began to speak with the patient and advised her that since she was late for her appt we would need to reschedule. She commented "since the doctor canceled on me to go out of town she should still see me". I advised the pt she had essentially missed her entire appointment at this point, she asked me what time it was and I let her know it was now 10:50 am and more than half of her appt was gone. Unfortunately Dr. Jerilee Hoh does not have any more openings for a physical today so we would need to reschedule. She stated "I will be seen by Dr. Jerilee Hoh today". I reiterated that her schedule was full today and this was not possible. The pt states she "has been coming here for 27 years and Dr. Sherren Mocha would never have treated her like this". I apologized and again stated we would need to reschedule her visit. At this point she said "You can kiss my royal ass" and disconnected the call.   Dr. Jerilee Hoh and Racheal Patches were both advised of the situation.

## 2019-10-07 ENCOUNTER — Other Ambulatory Visit: Payer: Self-pay | Admitting: *Deleted

## 2019-10-07 DIAGNOSIS — Z0000 Encounter for general adult medical examination without abnormal findings: Secondary | ICD-10-CM

## 2019-10-07 MED ORDER — ESOMEPRAZOLE MAGNESIUM 20 MG PO CPDR: 20.0000 mg | DELAYED_RELEASE_CAPSULE | Freq: Every day | ORAL | 1 refills | Status: AC

## 2019-11-02 ENCOUNTER — Other Ambulatory Visit: Payer: Self-pay | Admitting: Internal Medicine

## 2019-11-02 DIAGNOSIS — G40909 Epilepsy, unspecified, not intractable, without status epilepticus: Secondary | ICD-10-CM

## 2019-12-13 ENCOUNTER — Ambulatory Visit: Payer: Medicare HMO | Attending: Internal Medicine

## 2019-12-13 DIAGNOSIS — Z23 Encounter for immunization: Secondary | ICD-10-CM | POA: Insufficient documentation

## 2019-12-13 NOTE — Progress Notes (Signed)
   Covid-19 Vaccination Clinic  Name:  Jennifer Mcneil    MRN: [id number] DOB: 1946-01-29  12/13/2019  [name] was observed post Covid-19 immunization for 15 minutes without incidence. She was provided with Vaccine Information Sheet and instruction to access the V-Safe system.   [name] was instructed to call 911 with any severe reactions post vaccine: Marland Kitchen Difficulty breathing  . Swelling of your face and throat  . A fast heartbeat  . A bad rash all over your body  . Dizziness and weakness    Immunizations Administered    Name Date Dose VIS Date Route   Pfizer COVID-19 Vaccine 12/13/2019 12:25 PM 0.3 mL 11/04/2019 Intramuscular   Manufacturer: [place]   Lot: [id number]   [place]: [id number]

## 2020-01-02 ENCOUNTER — Ambulatory Visit: Payer: Medicare HMO | Attending: Internal Medicine

## 2020-01-02 DIAGNOSIS — Z23 Encounter for immunization: Secondary | ICD-10-CM | POA: Insufficient documentation

## 2020-01-02 NOTE — Progress Notes (Signed)
   Covid-19 Vaccination Clinic  Name:  Jennifer Mcneil    MRN: [id number] DOB: 06-15-1946  01/02/2020  Jennifer Mcneil was observed post Covid-19 immunization for 15 minutes without incidence. She was provided with Vaccine Information Sheet and instruction to access the V-Safe system.   Jennifer Mcneil was instructed to call 911 with any severe reactions post vaccine: Marland Kitchen Difficulty breathing  . Swelling of your face and throat  . A fast heartbeat  . A bad rash all over your body  . Dizziness and weakness    Immunizations Administered    Name Date Dose VIS Date Route   Pfizer COVID-19 Vaccine 01/02/2020 11:23 AM 0.3 mL 11/04/2019 Intramuscular   Manufacturer: [place]   Lot: [id number]   [place]: [id number]

## 2020-02-23 ENCOUNTER — Other Ambulatory Visit: Payer: Self-pay | Admitting: Internal Medicine

## 2020-02-23 DIAGNOSIS — Z0000 Encounter for general adult medical examination without abnormal findings: Secondary | ICD-10-CM

## 2020-05-20 ENCOUNTER — Other Ambulatory Visit: Payer: Self-pay | Admitting: Internal Medicine

## 2020-05-20 DIAGNOSIS — Z0000 Encounter for general adult medical examination without abnormal findings: Secondary | ICD-10-CM

## 2020-09-20 ENCOUNTER — Other Ambulatory Visit: Payer: Self-pay | Admitting: Internal Medicine

## 2020-09-20 DIAGNOSIS — Z0000 Encounter for general adult medical examination without abnormal findings: Secondary | ICD-10-CM

## 2020-12-11 ENCOUNTER — Other Ambulatory Visit: Payer: Self-pay | Admitting: Internal Medicine

## 2020-12-11 DIAGNOSIS — Z0000 Encounter for general adult medical examination without abnormal findings: Secondary | ICD-10-CM

## 2021-03-10 ENCOUNTER — Other Ambulatory Visit: Payer: Self-pay | Admitting: Internal Medicine

## 2021-03-10 DIAGNOSIS — Z0000 Encounter for general adult medical examination without abnormal findings: Secondary | ICD-10-CM

## 2021-04-07 ENCOUNTER — Other Ambulatory Visit: Payer: Self-pay | Admitting: Internal Medicine

## 2021-04-07 DIAGNOSIS — Z0000 Encounter for general adult medical examination without abnormal findings: Secondary | ICD-10-CM

## 2021-07-06 ENCOUNTER — Other Ambulatory Visit: Payer: Self-pay | Admitting: Internal Medicine

## 2021-07-06 DIAGNOSIS — Z0000 Encounter for general adult medical examination without abnormal findings: Secondary | ICD-10-CM

## 2021-09-23 ENCOUNTER — Other Ambulatory Visit: Payer: Self-pay | Admitting: Internal Medicine

## 2021-09-23 DIAGNOSIS — Z0000 Encounter for general adult medical examination without abnormal findings: Secondary | ICD-10-CM
